# Patient Record
Sex: Male | Born: 1950 | Race: White | Hispanic: No | Smoking: Former smoker
Health system: Southern US, Community
[De-identification: ages and names within clinical notes are randomized; demographics above are authoritative.]

## PROBLEM LIST (undated history)

## (undated) DIAGNOSIS — I10 Essential (primary) hypertension: Secondary | ICD-10-CM

## (undated) DIAGNOSIS — Z951 Presence of aortocoronary bypass graft: Secondary | ICD-10-CM

## (undated) DIAGNOSIS — I639 Cerebral infarction, unspecified: Secondary | ICD-10-CM

## (undated) DIAGNOSIS — Z87891 Personal history of nicotine dependence: Secondary | ICD-10-CM

## (undated) DIAGNOSIS — IMO0002 Reserved for concepts with insufficient information to code with codable children: Secondary | ICD-10-CM

## (undated) DIAGNOSIS — E785 Hyperlipidemia, unspecified: Secondary | ICD-10-CM

## (undated) DIAGNOSIS — R55 Syncope and collapse: Secondary | ICD-10-CM

## (undated) DIAGNOSIS — N200 Calculus of kidney: Secondary | ICD-10-CM

## (undated) DIAGNOSIS — I2510 Atherosclerotic heart disease of native coronary artery without angina pectoris: Secondary | ICD-10-CM

## (undated) DIAGNOSIS — R9430 Abnormal result of cardiovascular function study, unspecified: Secondary | ICD-10-CM

## (undated) HISTORY — DX: Cerebral infarction, unspecified: I63.9

## (undated) HISTORY — DX: Presence of aortocoronary bypass graft: Z95.1

## (undated) HISTORY — DX: Abnormal result of cardiovascular function study, unspecified: R94.30

## (undated) HISTORY — DX: Personal history of nicotine dependence: Z87.891

## (undated) HISTORY — DX: Syncope and collapse: R55

## (undated) HISTORY — DX: Hyperlipidemia, unspecified: E78.5

## (undated) HISTORY — DX: Essential (primary) hypertension: I10

## (undated) HISTORY — DX: Reserved for concepts with insufficient information to code with codable children: IMO0002

## (undated) HISTORY — DX: Atherosclerotic heart disease of native coronary artery without angina pectoris: I25.10

## (undated) HISTORY — DX: Calculus of kidney: N20.0

---

## 2010-06-19 ENCOUNTER — Encounter: Payer: Self-pay | Admitting: Cardiology

## 2010-07-05 DIAGNOSIS — E669 Obesity, unspecified: Secondary | ICD-10-CM

## 2010-07-05 DIAGNOSIS — R3129 Other microscopic hematuria: Secondary | ICD-10-CM

## 2010-07-08 ENCOUNTER — Encounter (INDEPENDENT_AMBULATORY_CARE_PROVIDER_SITE_OTHER): Payer: Self-pay | Admitting: *Deleted

## 2010-07-08 ENCOUNTER — Ambulatory Visit: Payer: Self-pay | Admitting: Cardiology

## 2010-07-11 ENCOUNTER — Ambulatory Visit: Payer: Self-pay | Admitting: Cardiology

## 2010-07-11 ENCOUNTER — Encounter: Payer: Self-pay | Admitting: Cardiology

## 2010-07-15 ENCOUNTER — Encounter (INDEPENDENT_AMBULATORY_CARE_PROVIDER_SITE_OTHER): Payer: Self-pay | Admitting: *Deleted

## 2010-07-17 ENCOUNTER — Encounter: Payer: Self-pay | Admitting: Internal Medicine

## 2010-07-18 ENCOUNTER — Ambulatory Visit: Payer: Self-pay | Admitting: Cardiology

## 2010-07-18 ENCOUNTER — Encounter: Payer: Self-pay | Admitting: Cardiology

## 2010-07-19 ENCOUNTER — Encounter: Payer: Self-pay | Admitting: Physician Assistant

## 2010-07-19 ENCOUNTER — Encounter (INDEPENDENT_AMBULATORY_CARE_PROVIDER_SITE_OTHER): Payer: Self-pay | Admitting: *Deleted

## 2010-07-22 ENCOUNTER — Inpatient Hospital Stay (HOSPITAL_COMMUNITY): Admission: RE | Admit: 2010-07-22 | Discharge: 2010-07-23 | Payer: Self-pay | Admitting: Internal Medicine

## 2010-07-22 ENCOUNTER — Ambulatory Visit: Payer: Self-pay | Admitting: Internal Medicine

## 2010-07-22 ENCOUNTER — Encounter: Payer: Self-pay | Admitting: Cardiology

## 2010-07-22 ENCOUNTER — Ambulatory Visit: Payer: Self-pay | Admitting: Vascular Surgery

## 2010-07-22 ENCOUNTER — Encounter: Payer: Self-pay | Admitting: Thoracic Surgery (Cardiothoracic Vascular Surgery)

## 2010-07-22 ENCOUNTER — Ambulatory Visit: Payer: Self-pay | Admitting: Thoracic Surgery (Cardiothoracic Vascular Surgery)

## 2010-07-23 ENCOUNTER — Encounter: Payer: Self-pay | Admitting: Internal Medicine

## 2010-07-23 ENCOUNTER — Telehealth (INDEPENDENT_AMBULATORY_CARE_PROVIDER_SITE_OTHER): Payer: Self-pay | Admitting: *Deleted

## 2010-07-23 ENCOUNTER — Encounter: Payer: Self-pay | Admitting: Cardiology

## 2010-07-24 ENCOUNTER — Ambulatory Visit: Payer: Self-pay | Admitting: Cardiology

## 2010-07-27 HISTORY — PX: CORONARY ARTERY BYPASS GRAFT: SHX141

## 2010-07-29 ENCOUNTER — Telehealth: Payer: Self-pay | Admitting: Internal Medicine

## 2010-07-30 ENCOUNTER — Inpatient Hospital Stay (HOSPITAL_COMMUNITY)
Admission: RE | Admit: 2010-07-30 | Discharge: 2010-08-03 | Payer: Self-pay | Admitting: Thoracic Surgery (Cardiothoracic Vascular Surgery)

## 2010-08-02 ENCOUNTER — Encounter: Payer: Self-pay | Admitting: Cardiology

## 2010-08-26 ENCOUNTER — Ambulatory Visit: Payer: Self-pay | Admitting: Thoracic Surgery (Cardiothoracic Vascular Surgery)

## 2010-08-26 ENCOUNTER — Encounter: Payer: Self-pay | Admitting: Cardiology

## 2010-08-26 ENCOUNTER — Encounter
Admission: RE | Admit: 2010-08-26 | Discharge: 2010-08-26 | Payer: Self-pay | Admitting: Thoracic Surgery (Cardiothoracic Vascular Surgery)

## 2010-08-29 ENCOUNTER — Encounter: Payer: Self-pay | Admitting: Cardiology

## 2010-09-09 ENCOUNTER — Ambulatory Visit: Payer: Self-pay | Admitting: Physician Assistant

## 2010-09-26 ENCOUNTER — Encounter: Payer: Self-pay | Admitting: Cardiology

## 2010-10-03 ENCOUNTER — Encounter: Payer: Self-pay | Admitting: Cardiology

## 2010-10-03 ENCOUNTER — Encounter: Payer: Self-pay | Admitting: Physician Assistant

## 2010-10-04 ENCOUNTER — Encounter: Payer: Self-pay | Admitting: Cardiology

## 2010-10-07 ENCOUNTER — Encounter (INDEPENDENT_AMBULATORY_CARE_PROVIDER_SITE_OTHER): Payer: Self-pay | Admitting: *Deleted

## 2010-10-16 ENCOUNTER — Encounter: Payer: Self-pay | Admitting: Cardiology

## 2010-10-30 ENCOUNTER — Encounter (INDEPENDENT_AMBULATORY_CARE_PROVIDER_SITE_OTHER): Payer: Self-pay | Admitting: *Deleted

## 2010-10-31 ENCOUNTER — Ambulatory Visit
Admission: RE | Admit: 2010-10-31 | Discharge: 2010-10-31 | Payer: Self-pay | Source: Home / Self Care | Attending: Cardiology | Admitting: Cardiology

## 2010-11-11 ENCOUNTER — Encounter: Payer: Self-pay | Admitting: Physician Assistant

## 2010-11-21 ENCOUNTER — Ambulatory Visit
Admission: RE | Admit: 2010-11-21 | Discharge: 2010-11-21 | Payer: Self-pay | Source: Home / Self Care | Attending: Physician Assistant | Admitting: Physician Assistant

## 2010-11-26 NOTE — Letter (Signed)
Summary: Internal Correspondence/ FAXED PRE-CATH ORDER  Internal Correspondence/ FAXED PRE-CATH ORDER   Imported By: Dorise Hiss 07/22/2010 16:59:13  _____________________________________________________________________  External Attachment:    Type:   Image     Comment:   External Document

## 2010-11-26 NOTE — Letter (Signed)
Summary: Graded Exercise Tolerance Test  [facility] at [facility] [address]. [address]   [place], [place] [address]   Phone: [phone]  Fax: [phone]      [facility] Cardiovascular Services  Graded Exercise Tolerance Test    Roy White  Appointment Date:_  Appointment Time:_   Your doctor has ordered a stress test to help determine the condition of your  heart during exercise. If you take blood pressure medicine , ask your doctor if you should take it the day of your test. You may eat a light meal before your test.  Please be sure to bring the copy of your order with you.   You should dress comfortably, for example: Sweat pants, shorts, or skirt, Loose                                                                                                       short sleeved T-shirt, Rubber soled lace-up shoes (tennis shoes)  You will need to arrive 15 minutes before your appointment time. You will also need to enter at the Main Entrance of the hospital and go to the registration desk. They will direct you to the Cardiovascular Department on the third floor.  You will need to plan on being at the hospital for one hour from registration for this appointment.

## 2010-11-26 NOTE — Miscellaneous (Signed)
Summary: Nuclear  Clinical Lists Changes  Observations: Added new observation of EKG INTERP: Nuclear Cardiology Conclusion : Abnormal LV perfusion.           Abnormal exercise myocardial perfusion with Tc-24m sestamibi imaging. Stress testing induced no chest pain symptoms and ECG changes consistent with ischemia. Horizontal ST depression in lateral leads was diagnostic of ischemia. Global left ventricular systolic function was normal, with an EF of 54%. In addition, there was normal wall motion.  There was a large, completely reversible, apical to basal- inferior/inferolateral defect associated with normal wall motion. This defect was consistent with ischemia. High risk study with large ischemia burden and marked ECG changes persisting several minutes in recovery consistent with ischemia.        (07/18/2010 12:40)      EKG  Procedure date:  07/18/2010  Findings:      Nuclear Cardiology Conclusion : Abnormal LV perfusion.           Abnormal exercise myocardial perfusion with Tc-39m sestamibi imaging. Stress testing induced no chest pain symptoms and ECG changes consistent with ischemia. Horizontal ST depression in lateral leads was diagnostic of ischemia. Global left ventricular systolic function was normal, with an EF of 54%. In addition, there was normal wall motion.  There was a large, completely reversible, apical to basal- inferior/inferolateral defect associated with normal wall motion. This defect was consistent with ischemia. High risk study with large ischemia burden and marked ECG changes persisting several minutes in recovery consistent with ischemia.

## 2010-11-26 NOTE — Miscellaneous (Signed)
Summary: Rehab Report/ CARDIAC REHAB PROGRESS REPORT  Rehab Report/ CARDIAC REHAB PROGRESS REPORT   Imported By: Dorise Hiss 09/27/2010 11:45:32  _____________________________________________________________________  External Attachment:    Type:   Image     Comment:   External Document

## 2010-11-26 NOTE — Letter (Signed)
Summary: REFERRAL FROM [facility] MEDICINE  REFERRAL FROM [facility] MEDICINE   Imported By: Rexene Alberts 07/17/2010 16:35:29  _____________________________________________________________________  External Attachment:    Type:   Image     Comment:   External Document

## 2010-11-26 NOTE — Miscellaneous (Signed)
Summary: NUCLEAR STRESS TEST ORDER  Clinical Lists Changes  Problems: Added new problem of NONSPECIFIC ABNORMAL UNSPEC CV FUNCTION STUDY (ICD-794.30) Orders: Added new Referral order of Nuclear Med (Nuc Med) - Signed

## 2010-11-26 NOTE — Progress Notes (Signed)
Summary: re disability forms   Phone Note Call from Patient   Caller: Patient [phone] Reason for Call: Talk to Nurse Summary of Call: disabilty papers need to be faxed to [phone] attn teresa nance Initial call taken by: Glynda Jaeger,  July 29, 2010 3:23 PM  Follow-up for Phone Call        spoke w/pts mother, pt is sch for CABG tom. am, she gave me pts cell # [phone], called and spoke w/pt he wants [place] to fax his forms to the person and # listed above when they're done, advised forms are complete just waiting on Dr Lindaann Slough signature and we can fax Meredith Staggers, RN  July 29, 2010 6:16 PM      Appended Document: re disability forms  waiting to be signed paperwork signed and sent to medical records to be scanned into chart.

## 2010-11-26 NOTE — Progress Notes (Signed)
  Pt Dropped off papers from [facility], He also completed Packet for [employer] all sent to [employer]  July 23, 2010 4:04 PM

## 2010-11-26 NOTE — Miscellaneous (Signed)
Summary: Rehab Report/ CARDIAC REHAB PROGRESS REPORT  Rehab Report/ CARDIAC REHAB PROGRESS REPORT   Imported By: Dorise Hiss 08/30/2010 10:02:47  _____________________________________________________________________  External Attachment:    Type:   Image     Comment:   External Document

## 2010-11-26 NOTE — Progress Notes (Signed)
Summary: Office Visit/ TCT OFFICE VISIT  Office Visit/ TCT OFFICE VISIT   Imported By: Dorise Hiss 09/09/2010 09:56:46  _____________________________________________________________________  External Attachment:    Type:   Image     Comment:   External Document

## 2010-11-26 NOTE — Letter (Signed)
Summary: Pharmacist, community at [facility]. [address]   [place], [place] [address]   Phone: [phone]  Fax: [phone]      [facility] Cardiovascular Services  Cardiolite Stress Test     Roy White  Your doctor has ordered a Cardiolite Stress Test to help determine the condition of your heart during stress. If you take blood pressure medicine, ask your doctor if you should take it the day of your test. You should not have anything to eat or drink at least 4 hours before your test is scheduled, and no caffeine (coffee, tea, decaf. or chocolate) for 24 hours before your test.   You will need to register at the Outpatient/Main Entrance at the hospital 30 minutes before your appointment time. It is a good idea to bring a copy of your order with you. They will direct you to the Diagnostic Imaging (Radiology) Department.  You will be asked to undress from the waist up and be given a hospital gown to wear, so dress comfortably from the waist down, for example:    Sweat pants, shorts or skirt   Rubber-soled lace up shoes (i.e. tennis shoes)  Plan on about three hours from registration to release from the hospital.    Date of Test:              Time of Test

## 2010-11-26 NOTE — Assessment & Plan Note (Signed)
Summary: NP-CHEST PAIN  HYPERTENSION   Visit Type:  Initial Consult Primary Provider:  Tammy Sours Napier,MD  CC:  SOB and Abnormal EKG.  History of Present Illness: The patient has no prior cardiac history. Over the summer while mowing the lawn he noticed episodes of shortness of breath with some mild chest tightness. This only persisted during this activity. It would go away when he rested. He did not describe neck or arm discomfort. He was not describing resting symptom and had no PND or orthopnea. He was not describing any palpitations, presyncope or syncope. He was noted to be hypertensive and has since been treated. He says his blood pressures have been much better and he is no longer getting these symptoms. Of note he did have an EKG demonstrating T wave inversions in the anterolateral leads with LVH voltage. He's had no prior cardiac workup. 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  Preventive Screening-Counseling & Management  Alcohol-Tobacco     Smoking Status: quit     Year Quit: 2005  Current Medications (verified): 1)  Lisinopril-Hydrochlorothiazide 20-12.5 Mg Tabs (Lisinopril-Hydrochlorothiazide) .... Take 1 Tablet By Mouth Once A Day  Allergies (verified): No Known Drug Allergies  Comments:  Nurse/Medical Assistant: The patient's medication list and allergies were reviewed with the patient and were updated in the Medication and Allergy Lists.  Past History:  Past Medical History: Hypertension (off and on x years) Nephrolithiasis  Past Surgical History: None  Family History: Mother alive CVAs Father alive no CAD No early heart disease  Social History: Full Time-works at [employer]  Tobacco off and on x 10 years.  Quit 5 years ago.Smoking Status:  quit  Review of Systems       Positive for heartburn. Otherwise as stated in the history of  present illness negative for all other systems.  Vital Signs:  Patient profile:   60 year old male Height:      64 inches Weight:      176 pounds BMI:     30.32 O2 Sat:      97 % on Room air Pulse rate:   66 / minute BP sitting:   123 / 84  (left arm) Cuff size:   regular  Vitals Entered By: Carlye Grippe (July 08, 2010 1:03 PM)  Nutrition Counseling: Patient's BMI is greater than 25 and therefore counseled on weight management options.  O2 Flow:  Room air CC: SOB, Abnormal EKG   Physical Exam  General:  Well developed, well nourished, in no acute distress. Head:  normocephalic and atraumatic Eyes:  PERRLA/EOM intact; conjunctiva and lids normal. Neck:  Neck supple, no JVD. No masses, thyromegaly or abnormal cervical nodes. Chest Wall:  no deformities or breast masses noted Lungs:  Clear bilaterally to auscultation and percussion. Abdomen:  Bowel sounds positive; abdomen soft and non-tender without masses, organomegaly, or hernias noted. No hepatosplenomegaly. Msk:  Back normal, normal gait. Muscle strength and tone normal. Extremities:  No clubbing or cyanosis. Neurologic:  Alert and oriented x 3. Skin:  Intact without lesions or rashes. Cervical Nodes:  no significant adenopathy Axillary Nodes:  no significant adenopathy Inguinal Nodes:  no significant adenopathy Psych:  Normal affect.   Detailed Cardiovascular Exam  Neck    Carotids: Carotids full and equal bilaterally without bruits.      Neck Veins: Normal, no JVD.  Heart    Inspection: no deformities or lifts noted.      Palpation: normal PMI with no thrills palpable.      Auscultation: regular rate and rhythm, S1, S2 without murmurs, rubs, gallops, or clicks.    Vascular    Abdominal Aorta: no palpable masses, pulsations, or audible bruits.      Femoral Pulses: normal femoral pulses bilaterally.      Pedal Pulses: normal pedal pulses bilaterally.      Radial Pulses: normal radial pulses  bilaterally.      Peripheral Circulation: no clubbing, cyanosis, or edema noted with normal capillary refill.     EKG  Procedure date:  06/19/2010  Findings:      Sinus rhythm, rate 61, left ventricle hypertrophy by voltage criteria, anterolateral T wave inversions, no old EKGs for comparison  Impression & Recommendations:  Problem # 1:  OTHER CHEST PAIN (ICD-786.59)  The patient described chest tightness and dyspnea. This has since resolved. He does have an abnormal EKG which I think is LVH with rate pole. However, given the combination I think screening with a treadmill test is indicated. Further evaluation will be based on these results. Regardless he needs primary risk reduction.  Orders: GXT (GXT)  Problem # 2:  HYPERTENSION (ICD-401.9) His blood pressure is now well controlled and he will continue the meds as listed.  Problem # 3:  DYSLIPIDEMIA (ICD-272.4) I did review lipids from August with an LDL of 126, HDL 35 and triglycerides of 208. We discussed diet and in particular exercise. He's been more compliant with both and I suspect he'll have improvement. I think optimal for this patient would be an LDL less than 100 and HDL greater and 40. At this point pharmacologic therapy is not indicated.  Patient Instructions: 1)  Exercise stress test 2)  Follow up as needed

## 2010-11-26 NOTE — Letter (Signed)
Summary: External Correspondence/ PROGRESS NOTE W LABS DR. NAPIER  External Correspondence/ PROGRESS NOTE W LABS DR. NAPIER   Imported By: Dorise Hiss 06/26/2010 14:42:19  _____________________________________________________________________  External Attachment:    Type:   Image     Comment:   External Document

## 2010-11-26 NOTE — Letter (Signed)
Summary: Cardiac Cath Instructions - Main Lab  [facility] at [facility]. [address]   [place], [place] [address]   Phone: [phone]  Fax: [phone]     07/19/2010 MRN: [id number]  Roy White [address] [place], [place]  [address]  Dear Roy White,   You are scheduled for Cardiac Catheterization on Monday, July 22, 2010 with Dr. Gala Romney.  Please arrive at the [facility] at       0630 am on the day of your procedure.  1. DIET     __X__ Nothing to eat or drink after midnight except your medications with a sip of water.  2. Come to the [place] office on             for lab work.  The lab at [facility] is open from 8:30 a.m. to 1:30 p.m. and 2:30 p.m. to 5:00 p.m.  The lab at [facility] is open from 7:30 a.m. to 5:30 p.m.  You do not have to be fasting.  3. MAKE SURE YOU TAKE YOUR ASPIRIN and Plavix.  4. __X___ DO NOT TAKE these medications the morning of your procedure: Lisinopril/HCTZ     5. __X___ YOU MAY TAKE all of your remaining medications with a small amount of water.  6.__X___ START NEW medications: ASA 325MG  by mouth once daily, PLAVIX 75MG  TAKE 2 TABLETS ON FRI, SEPT 23RD AND THEN 1 TABLET DAILY, TOPROL XL (METOPROLOL) 25MG  by mouth once daily.  7._____ Pre-med instructions:     _______________________________________________________________________  8. Plan for one night stay - bring personal belongings (i.e. toothpaste, toothbrush, etc.)  9. Bring a current list of your medications and current insurance cards.  10.Must have a responsible person to drive you home.   11.Someone must be with you for the first 24 hours after you arrive home.  9. Please wear clothes that are easy to get on and off and wear slip-on shoes.  *Special note: Every effort is made to have your procedure done on time.  Occasionally there are emergencies that present themselves at the hospital that may  cause delays.  Please be patient if a delay does occur.  If you have any questions after you get home, please call the office at the number listed above.  Cyril Loosen, RN, BSN

## 2010-11-26 NOTE — Assessment & Plan Note (Signed)
Summary: EPH-POST HEART SURGERY AT [facility] 10/3   Visit Type:  Follow-up Primary Provider:  Cordie Grice   History of Present Illness: patient presents for post hospital followup, after undergoing three-vessel CABG, by Dr. Cornelius Moras, at [facility], on October 4. He has been cleared by them, and is enrolled in cardiac rehabilitation.  Clinically, he feels "much better", and denies any exertional chest discomfort. He does have some mild soreness. He denies any tach palpitations.  Patient reports an episode yesterday where he experienced transient hypotension (systolic 90s), after having walked 3 miles. He did take his low dose Toprol in the morning. He typically has blood pressure readings around 130s systolic, and was on an ACE inhibitor/HCTZ, prior to his cardiac workup. He is no longer on this medication.  Postop course was benign, with no documented dysrhythmias.  Preventive Screening-Counseling & Management  Alcohol-Tobacco     Smoking Status: quit     Year Quit: 2005  Current Medications (verified): 1)  Aspirin Ec 325 Mg Tbec (Aspirin) .... Take One Tablet By Mouth Daily 2)  Toprol Xl 25 Mg Xr24h-Tab (Metoprolol Succinate) .... Take 1 Tablet By Mouth Once A Day 3)  Crestor 20 Mg Tabs (Rosuvastatin Calcium) .... Take 1 Tablet By Mouth Once A Day 4)  Indomethacin 25 Mg Caps (Indomethacin) .... Take 1 Tablet By Mouth Once A Day 5)  Robitussin Cough/chest Dm Max 10-200 Mg/8ml Liqd (Dextromethorphan-Guaifenesin) .... As Needed  Allergies (verified): No Known Drug Allergies  Comments:  Nurse/Medical Assistant: The patient's medication bottles and allergies were reviewed with the patient and were updated in the Medication and Allergy Lists.  Past History:  Past Medical History: Hypertension (off and on x years) Nephrolithiasis CAD... 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 All other systems negative.   Vital Signs:  Patient profile:   60 year old male Height:      64 inches Weight:      162 pounds Pulse rate:   77 / minute BP sitting:   138 / 90  (left arm) Cuff size:   regular  Vitals Entered By: Carlye Grippe (September 09, 2010 10:10 AM)  Physical Exam  Additional Exam:  GEN: 60 year old male, sitting upright, no distress HEENT: NCAT,PERRLA,EOMI NECK: palpable pulses, no bruits; no JVD; no TM LUNGS: CTA bilaterally HEART: RRR (S1S2); no significant murmurs; no rubs; no gallops ABD: soft, NT; intact BS EXT: intact distal pulses; no edema SKIN: warm, dry MUSC: no obvious deformity NEURO: A/O (x3)     EKG  Procedure date:  09/09/2010  Findings:      normal sinus rhythm at 74 bpm; normal axis; T wave inversion in inferolateral leads  Impression & Recommendations:  Problem # 1:  CAD (ICD-414.00)  clinically stable, following recent CABG, 10/11. Patient has normal LVEF. On minimal medications, including low-dose beta blocker.  Problem # 2:  DYSLIPIDEMIA (ICD-272.4)  recently started on Crestor. Reassess lipid status with FLP in 2 months. Aggressive management recommended with target LDL 70 or less, if feasible.  Problem # 3:  HYPERTENSION (ICD-401.9)  continue monitoring closely. Currently only on low-dose beta blocker, but had been on ACE/HCTZ prior to recent cardiac workup.  Other Orders: EKG w/ Interpretation (93000) Future Orders: T-Lipid Profile (84132-44010) ... 11/04/2010  Patient Instructions: 1)  Your physician wants you to follow-up  in: 3 months. You will receive a reminder letter in the mail one-two months in advance. If you don't receive a letter, please call our office to schedule the follow-up appointment. 2)  Your physician recommends that you go to the [facility] for a FASTING lipid profile and liver  function labs:  DO AROUND November 11, 2010. TAKE ORDER WITH YOU TO THE [facility]. Do not eat or drink after midnight.  3)  Your physician recommends that you continue on your current medications as directed. Please refer to the Current Medication list given to you today.

## 2010-11-28 ENCOUNTER — Encounter: Payer: Self-pay | Admitting: Physician Assistant

## 2010-11-28 NOTE — Miscellaneous (Signed)
Summary: Rehab Report/ CARDIAC REHAB DISCHARGE SUMMARY  Rehab Report/ CARDIAC REHAB DISCHARGE SUMMARY   Imported By: Dorise Hiss 10/24/2010 16:39:24  _____________________________________________________________________  External Attachment:    Type:   Image     Comment:   External Document

## 2010-11-28 NOTE — Consult Note (Signed)
Summary: CARDIOLOGY CONSULT /[facility]  CARDIOLOGY CONSULT /[facility]   Imported By: Zachary George 10/30/2010 10:43:52  _____________________________________________________________________  External Attachment:    Type:   Image     Comment:   External Document

## 2010-11-28 NOTE — Letter (Signed)
Summary: TC & TS  TC & TS   Imported By: Marylou Mccoy 10/31/2010 16:28:38  _____________________________________________________________________  External Attachment:    Type:   Image     Comment:   External Document

## 2010-11-28 NOTE — Assessment & Plan Note (Signed)
Summary: f2wks  --agh   Visit Type:  Follow-up Primary Provider:  Tammy Sours Napier,MD   History of Present Illness: Roy White returns for early scheduled follow up with me.  When last seen, I tapered him off Toprol, and decreased aspirin. I concluded that his recent syncopal episode was most likely secondary to dehydration. He had already begun to feel better off ACE inhibitor. He tells me today that he continues to feel better, having now been taken off beta blocker completely.  He denies any falls or postural dizziness. He continues to report no chest pain.  Recent labs notable for LDL 120 on current dose of Crestor, which was started following CABG.  Preventive Screening-Counseling & Management  Alcohol-Tobacco     Smoking Status: quit     Year Quit: 2003  Current Medications (verified): 1)  Aspirin 81 Mg Tbec (Aspirin) .... Take 1 Tablet By Mouth Once A Day 2)  Crestor 20 Mg Tabs (Rosuvastatin Calcium) .... Take 1 Tablet By Mouth Once A Day 3)  Indomethacin 25 Mg Caps (Indomethacin) .... Take 1 Tablet By Mouth Once A Day 4)  Robitussin Cough/chest Dm Max 10-200 Mg/76ml Liqd (Dextromethorphan-Guaifenesin) .... As Needed  Allergies (verified): No Known Drug Allergies  Comments:  Nurse/Medical Assistant: The Roy White's medications and allergies were verbally reviewed with the Roy White and were updated in the Medication and Allergy Lists.  Past History:  Past Medical History: Last updated: 09/09/2010 Hypertension (off and on x years) Nephrolithiasis CAD... 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 All other systems negative.   Vital Signs:  Roy White profile:   60 year old male Height:      64 inches Weight:      160 pounds Pulse rate:   57 / minute BP sitting:    132 / 82  (left arm) Cuff size:   regular  Vitals Entered By: Carlye Grippe (November 21, 2010 9:46 AM)  Physical Exam  Additional Exam:  GEN: 60 year old male, sitting upright, no distress HEENT: NCAT,PERRLA,EOMI NECK: palpable pulses, no bruits; no JVD; no TM LUNGS: CTA bilaterally HEART: RRR (S1S2); no significant murmurs; no rubs; no gallops ABD: soft, NT; intact BS EXT: intact distal pulses; no edema SKIN: warm, dry MUSC: no obvious deformity NEURO: A/O (x3)     Impression & Recommendations:  Problem # 1:  SYNCOPE (ICD-780.2)  Resolved, following recent adjustment of his medications. He is now off all antihypertensives. I suspect that his recent syncopal episode was do to a combination of volume depletion and effect of beta blocker. Of note, he was not on any anti-HTN, prior to CABG last October. Would not resume anti-HTN therapy, unless he demonstrates persistently elevated BPs. He states that these are in the 120/80 range at home.  Problem # 2:  DYSLIPIDEMIA (ICD-272.4)  recent LDL 120. Will increase Crestor to 40 mg daily, and repeat FLP/LFT profile in 12 weeks.  His updated medication list for this problem includes:    Crestor 40 Mg Tabs (Rosuvastatin calcium) .Marland Kitchen... Take one tablet by mouth at bedtime.  Roy White Instructions: 1)  Your physician wants you to follow-up in: 6 months. You will receive a reminder letter in the mail one-two months in advance. If you don't receive a letter, please call our office to schedule the follow-up appointment. (Dr. Myrtis Ser) 2)  Your physician recommends that  you go to the [facility] for a FASTING lipid profile and liver function labs:  DO IN 3 MONTHS. CALL THE OFFICE TO SEND ORDER TO THE [facility]. 3)  Increase Crestor to 40mg  by mouth once at bedtime. You may take 2 of your 20mg  tablets until gone and then get new prescription filled for 40mg  tablets. Prescriptions: CRESTOR 40 MG TABS (ROSUVASTATIN CALCIUM) Take one tablet by  mouth at bedtime.  #30 x 6   Entered by:   Cyril Loosen, RN, BSN   Authorized by:   Nelida Meuse, PA-C   Signed by:   Cyril Loosen, RN, BSN on 11/21/2010   Method used:   Electronically to        [facility] (retail)       [address]       [place], [place]  [address]       Ph: [phone]       Fax: [phone]   RxID:   [id number]  I have reviewed and approved all prescriptions at the time of the office visit. Nelida Meuse, PA-C  November 21, 2010 10:16 AM

## 2010-11-28 NOTE — Letter (Signed)
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   [place], [place] [address]   Phone: [phone]  Fax: [phone]        October 07, 2010 MRN: [id number]    Shaheed Mallek [address] [place], [place]  [address]    Dear Mr. NOURI,  Your test ordered by Selena Batten has been reviewed by your physician (or physician assistant) and was found to be normal or stable. Your physician (or physician assistant) felt no changes were needed at this time.  ____ Echocardiogram  ____ Cardiac Stress Test  __X__ Lab Work  ____ Peripheral vascular study of arms, legs or neck  ____ CT scan or X-ray  ____ Lung or Breathing test  ____ Other:   Thank you.   Cyril Loosen, RN, BSN    Duane Boston, M.D., F.A.C.C. Thressa Sheller, M.D., F.A.C.C. Oneal Grout, M.D., F.A.C.C. Cheree Ditto, M.D., F.A.C.C. Daiva Nakayama, M.D., F.A.C.C. Kenney Houseman, M.D., F.A.C.C. Jeanne Ivan, PA-C

## 2010-11-28 NOTE — Assessment & Plan Note (Signed)
Summary: [facility] D/C [facility] 12-9 PER DR. PARSONS   Visit Type:  Follow-up Primary Provider:  Cordie Grice   History of Present Illness: pt presents for post [facility] f/u, after recent referral to Dr Myrtis Ser for evaluation of syncope.  pt ruled out for MI, and was treated for hypotension with D/C of Lisinopril. Toprol was continued. Orthostatics were done, notable for no significant drop in SBP, but with increase in HR of 30 bpm.  since his release, he has not had recurrent LOC. his initial episode occurred during Cardiac Rehab, after having completed exercising, and while seated. he states he slumped to the floor, and had a recorded SBP of approx. 60.  the episode was apparently brief in duration.   pt denies any tachypalpitations. he had some dizziness this past Sat, while washing his car: no CP, no frank LOC. symptoms resolved with rest, with reported SBP approx. 80.  Preventive Screening-Counseling & Management  Alcohol-Tobacco     Smoking Status: quit     Year Quit: 2003  Problems Prior to Update: 1)  Dyslipidemia  (ICD-272.4) 2)  Cad  (ICD-414.00) 3)  Oth Nonspecific Abnorm Cv System Function Study  (ICD-794.39) 4)  Nonspecific Abnormal Unspec Cv Function Study  (ICD-794.30) 5)  Dyslipidemia  (ICD-272.4) 6)  Microscopic Hematuria  (ICD-599.72) 7)  Obesity, Unspecified  (ICD-278.00) 8)  Other Chest Pain  (ICD-786.59) 9)  Hypertension  (ICD-401.9)  Current Medications (verified): 1)  Aspirin 81 Mg Tbec (Aspirin) .... Take 1 Tablet By Mouth Once A Day 2)  Toprol Xl 25 Mg Xr24h-Tab (Metoprolol Succinate) .... Take 1/2 Tab (12.5mg ) Daily X 7 Days, Then Stop 3)  Crestor 20 Mg Tabs (Rosuvastatin Calcium) .... Take 1 Tablet By Mouth Once A Day 4)  Indomethacin 25 Mg Caps (Indomethacin) .... Take 1 Tablet By Mouth Once A Day 5)  Robitussin Cough/chest Dm Max 10-200 Mg/69ml Liqd (Dextromethorphan-Guaifenesin) .... As Needed  Allergies (verified): No Known Drug  Allergies  Comments:  Nurse/Medical Assistant: The patient's medications and allergies were reviewed with the patient and were updated in the Medication and Allergy Lists. Reviewed list w/ pt. Tammi Romine CMA (October 31, 2010 1:55 PM)  Past History:  Past Medical History: Last updated: 09/09/2010 Hypertension (off and on x years) Nephrolithiasis CAD... 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 All other systems negative.   Vital Signs:  Patient profile:   60 year old male Height:      64 inches Weight:      166 pounds Pulse rate:   65 / minute BP sitting:   118 / 83  (left arm) Cuff size:   regular  Physical Exam  Additional Exam:  GEN: 60 year old male, sitting upright, no distress HEENT: NCAT,PERRLA,EOMI NECK: palpable pulses, no bruits; no JVD; no TM LUNGS: CTA bilaterally HEART: RRR (S1S2); no significant murmurs; no rubs; no gallops ABD: soft, NT; intact BS EXT: intact distal pulses; no edema SKIN: warm, dry MUSC: no obvious deformity NEURO: A/O (x3)     Impression & Recommendations:  Problem # 1:  SYNCOPE (ICD-780.2)  suspect secondary to dehydration, as corroborated by recent orthostatic BPs at [facility] no evidence of ischemia or dysrhythmia. he reports feeling better, since Ace inhibitor D/C'd. will taper and D/C Toprol, as well, in the absence of any h/o of MI, dysrhythmia, and  with NL LV. will scedule early f/u with me to reassess clinical status, as well as HR/BP. pt states that BP generally under 140 systolic, and he was only recently diagnosed with HTN and placed on ACE/HCTZ, per his primary MD.  Problem # 2:  CAD (ICD-414.00)  quiescent, since undergoing recent CABG.  Patient Instructions: 1)  Decrease Toprol to 12.5mg  daily x 7 days,  then stop 2)  Decrease Aspirin to 81mg  daily 3)  Follow up in  2 weeks

## 2010-11-28 NOTE — Letter (Signed)
Summary: Reminder for labs  [employer] at [facility]  [address]   [place], [place] [address]   Phone: [phone]  Fax: [phone]        October 30, 2010 MRN: [id number]    Roy White [address] [place], [place]  [address]    Dear [name],   According to our records, you are due for lab work to check your cholesterol and liver function.  Please, take the enclosed order to the [facility]. Do not eat or drink after midnight.     Sincerely,  Cyril Loosen, RN, BSN  This letter has been electronically signed by your physician.

## 2010-11-28 NOTE — Medication Information (Signed)
Summary: [facility] D/C MEDICATION SHEET  [facility] D/C MEDICATION SHEET   Imported By: Zachary George 10/30/2010 10:54:48  _____________________________________________________________________  External Attachment:    Type:   Image     Comment:   External Document

## 2010-11-29 ENCOUNTER — Encounter (INDEPENDENT_AMBULATORY_CARE_PROVIDER_SITE_OTHER): Payer: Self-pay | Admitting: *Deleted

## 2010-12-04 NOTE — Letter (Signed)
Summary: Engineer, materials at [facility]  [address]   [place], [place] [address]   Phone: [phone]  Fax: [phone]        November 29, 2010 MRN: [id number]    Roy White [address] [place], [place]  [address]    Dear [name],  Your test ordered by Selena Batten has been reviewed by your physician (or physician assistant) and was found to be normal or stable. Your physician (or physician assistant) felt no changes were needed at this time.  ____ Echocardiogram  ____ Cardiac Stress Test  __X__ Lab Work  ____ Peripheral vascular study of arms, legs or neck  ____ CT scan or X-ray  ____ Lung or Breathing test  ____ Other:   Thank you.   Cyril Loosen, RN, BSN    Duane Boston, M.D., F.A.C.C. Thressa Sheller, M.D., F.A.C.C. Oneal Grout, M.D., F.A.C.C. Cheree Ditto, M.D., F.A.C.C. Daiva Nakayama, M.D., F.A.C.C. Kenney Houseman, M.D., F.A.C.C. Jeanne Ivan, PA-C

## 2011-01-09 LAB — POCT I-STAT 3, ART BLOOD GAS (G3+)
Bicarbonate: 23.5 mEq/L (ref 20.0–24.0)
Bicarbonate: 25 mEq/L — ABNORMAL HIGH (ref 20.0–24.0)
O2 Saturation: 100 %
O2 Saturation: 99 %
TCO2: 25 mmol/L (ref 0–100)
TCO2: 26 mmol/L (ref 0–100)
pCO2 arterial: 39 mmHg (ref 35.0–45.0)
pCO2 arterial: 42 mmHg (ref 35.0–45.0)
pCO2 arterial: 45.9 mmHg — ABNORMAL HIGH (ref 35.0–45.0)
pH, Arterial: 7.318 — ABNORMAL LOW (ref 7.350–7.450)
pH, Arterial: 7.349 — ABNORMAL LOW (ref 7.350–7.450)
pO2, Arterial: 158 mmHg — ABNORMAL HIGH (ref 80.0–100.0)
pO2, Arterial: 164 mmHg — ABNORMAL HIGH (ref 80.0–100.0)
pO2, Arterial: 224 mmHg — ABNORMAL HIGH (ref 80.0–100.0)

## 2011-01-09 LAB — URINALYSIS, ROUTINE W REFLEX MICROSCOPIC
Bilirubin Urine: NEGATIVE
Hgb urine dipstick: NEGATIVE
Specific Gravity, Urine: 1.03 (ref 1.005–1.030)
Urobilinogen, UA: 1 mg/dL (ref 0.0–1.0)

## 2011-01-09 LAB — CBC
HCT: 27.5 % — ABNORMAL LOW (ref 39.0–52.0)
HCT: 28.6 % — ABNORMAL LOW (ref 39.0–52.0)
HCT: 34.4 % — ABNORMAL LOW (ref 39.0–52.0)
HCT: 35.4 % — ABNORMAL LOW (ref 39.0–52.0)
HCT: 44.2 % (ref 39.0–52.0)
Hemoglobin: 12.1 g/dL — ABNORMAL LOW (ref 13.0–17.0)
Hemoglobin: 9.1 g/dL — ABNORMAL LOW (ref 13.0–17.0)
Hemoglobin: 9.2 g/dL — ABNORMAL LOW (ref 13.0–17.0)
Hemoglobin: 9.6 g/dL — ABNORMAL LOW (ref 13.0–17.0)
Hemoglobin: 15.2 g/dL (ref 13.0–17.0)
Hemoglobin: 16.4 g/dL (ref 13.0–17.0)
MCH: 28 pg (ref 26.0–34.0)
MCH: 28 pg (ref 26.0–34.0)
MCH: 28.9 pg (ref 26.0–34.0)
MCH: 29.1 pg (ref 26.0–34.0)
MCHC: 32.2 g/dL (ref 30.0–36.0)
MCHC: 33.1 g/dL (ref 30.0–36.0)
MCHC: 33.2 g/dL (ref 30.0–36.0)
MCHC: 34 g/dL (ref 30.0–36.0)
MCHC: 34.2 g/dL (ref 30.0–36.0)
MCHC: 34.4 g/dL (ref 30.0–36.0)
MCHC: 34.7 g/dL (ref 30.0–36.0)
MCV: 83.9 fL (ref 78.0–100.0)
MCV: 84.3 fL (ref 78.0–100.0)
MCV: 84.9 fL (ref 78.0–100.0)
MCV: 87.2 fL (ref 78.0–100.0)
Platelets: 146 10*3/uL — ABNORMAL LOW (ref 150–400)
Platelets: 171 10*3/uL (ref 150–400)
Platelets: 203 10*3/uL (ref 150–400)
RBC: 3.41 MIL/uL — ABNORMAL LOW (ref 4.22–5.81)
RBC: 4.1 MIL/uL — ABNORMAL LOW (ref 4.22–5.81)
RBC: 5.24 MIL/uL (ref 4.22–5.81)
RDW: 12.9 % (ref 11.5–15.5)
RDW: 13 % (ref 11.5–15.5)
RDW: 13.2 % (ref 11.5–15.5)
WBC: 14.7 10*3/uL — ABNORMAL HIGH (ref 4.0–10.5)
WBC: 20.3 10*3/uL — ABNORMAL HIGH (ref 4.0–10.5)
WBC: 11.2 10*3/uL — ABNORMAL HIGH (ref 4.0–10.5)

## 2011-01-09 LAB — POCT I-STAT, CHEM 8
Calcium, Ion: 1.1 mmol/L — ABNORMAL LOW (ref 1.12–1.32)
Chloride: 105 mEq/L (ref 96–112)
Glucose, Bld: 148 mg/dL — ABNORMAL HIGH (ref 70–99)
HCT: 36 % — ABNORMAL LOW (ref 39.0–52.0)
Hemoglobin: 12.2 g/dL — ABNORMAL LOW (ref 13.0–17.0)

## 2011-01-09 LAB — BASIC METABOLIC PANEL
BUN: 11 mg/dL (ref 6–23)
BUN: 18 mg/dL (ref 6–23)
CO2: 24 mEq/L (ref 19–32)
CO2: 27 mEq/L (ref 19–32)
CO2: 29 mEq/L (ref 19–32)
CO2: 21 mEq/L (ref 19–32)
Calcium: 7.9 mg/dL — ABNORMAL LOW (ref 8.4–10.5)
Calcium: 8 mg/dL — ABNORMAL LOW (ref 8.4–10.5)
Calcium: 9.5 mg/dL (ref 8.4–10.5)
Chloride: 108 mEq/L (ref 96–112)
Creatinine, Ser: 0.99 mg/dL (ref 0.4–1.5)
Creatinine, Ser: 1.1 mg/dL (ref 0.4–1.5)
Glucose, Bld: 107 mg/dL — ABNORMAL HIGH (ref 70–99)
Glucose, Bld: 122 mg/dL — ABNORMAL HIGH (ref 70–99)
Glucose, Bld: 128 mg/dL — ABNORMAL HIGH (ref 70–99)
Glucose, Bld: 97 mg/dL (ref 70–99)
Potassium: 4.4 mEq/L (ref 3.5–5.1)
Sodium: 137 mEq/L (ref 135–145)
Sodium: 137 mEq/L (ref 135–145)

## 2011-01-09 LAB — BLOOD GAS, ARTERIAL
Bicarbonate: 24.2 mEq/L — ABNORMAL HIGH (ref 20.0–24.0)
Patient temperature: 98.6
TCO2: 25.5 mmol/L (ref 0–100)
pH, Arterial: 7.402 (ref 7.350–7.450)
pO2, Arterial: 86.8 mmHg (ref 80.0–100.0)

## 2011-01-09 LAB — CREATININE, SERUM
Creatinine, Ser: 0.98 mg/dL (ref 0.4–1.5)
Creatinine, Ser: 1.1 mg/dL (ref 0.4–1.5)
GFR calc Af Amer: >60 mL/min (ref >60)
GFR calc non Af Amer: >60 mL/min (ref >60)
GFR calc non Af Amer: >60 mL/min (ref >60)

## 2011-01-09 LAB — HEMOGLOBIN A1C: Hgb A1c MFr Bld: 5.5 % (ref <5.7)

## 2011-01-09 LAB — POCT I-STAT 4, (NA,K, GLUC, HGB,HCT)
Glucose, Bld: 101 mg/dL — ABNORMAL HIGH (ref 70–99)
Glucose, Bld: 109 mg/dL — ABNORMAL HIGH (ref 70–99)
Glucose, Bld: 116 mg/dL — ABNORMAL HIGH (ref 70–99)
Glucose, Bld: 95 mg/dL (ref 70–99)
HCT: 26 % — ABNORMAL LOW (ref 39.0–52.0)
HCT: 34 % — ABNORMAL LOW (ref 39.0–52.0)
HCT: 34 % — ABNORMAL LOW (ref 39.0–52.0)
Hemoglobin: 11.6 g/dL — ABNORMAL LOW (ref 13.0–17.0)
Hemoglobin: 8.8 g/dL — ABNORMAL LOW (ref 13.0–17.0)
Potassium: 4.2 mEq/L (ref 3.5–5.1)
Potassium: 4.4 mEq/L (ref 3.5–5.1)
Potassium: 4.6 mEq/L (ref 3.5–5.1)
Potassium: 4.9 mEq/L (ref 3.5–5.1)
Sodium: 131 mEq/L — ABNORMAL LOW (ref 135–145)
Sodium: 137 mEq/L (ref 135–145)
Sodium: 138 mEq/L (ref 135–145)

## 2011-01-09 LAB — APTT
aPTT: 34 seconds (ref 24–37)
aPTT: 29 seconds (ref 24–37)

## 2011-01-09 LAB — GLUCOSE, CAPILLARY
Glucose-Capillary: 101 mg/dL — ABNORMAL HIGH (ref 70–99)
Glucose-Capillary: 130 mg/dL — ABNORMAL HIGH (ref 70–99)
Glucose-Capillary: 131 mg/dL — ABNORMAL HIGH (ref 70–99)
Glucose-Capillary: 146 mg/dL — ABNORMAL HIGH (ref 70–99)
Glucose-Capillary: 167 mg/dL — ABNORMAL HIGH (ref 70–99)

## 2011-01-09 LAB — MAGNESIUM
Magnesium: 2.4 mg/dL (ref 1.5–2.5)
Magnesium: 3.2 mg/dL — ABNORMAL HIGH (ref 1.5–2.5)

## 2011-01-09 LAB — HEMOGLOBIN AND HEMATOCRIT, BLOOD: HCT: 27.5 % — ABNORMAL LOW (ref 39.0–52.0)

## 2011-01-09 LAB — COMPREHENSIVE METABOLIC PANEL
ALT: 22 U/L (ref 0–53)
Alkaline Phosphatase: 56 U/L (ref 39–117)
CO2: 23 mEq/L (ref 19–32)
Chloride: 105 mEq/L (ref 96–112)
GFR calc non Af Amer: >60 mL/min (ref >60)
Glucose, Bld: 86 mg/dL (ref 70–99)
Potassium: 4 mEq/L (ref 3.5–5.1)
Sodium: 137 mEq/L (ref 135–145)
Total Bilirubin: 1.3 mg/dL — ABNORMAL HIGH (ref 0.3–1.2)

## 2011-01-09 LAB — PROTIME-INR
INR: 1.27 (ref 0.00–1.49)
Prothrombin Time: 13.5 seconds (ref 11.6–15.2)
Prothrombin Time: 13.8 seconds (ref 11.6–15.2)

## 2011-01-09 LAB — LIPID PANEL
Total CHOL/HDL Ratio: 7.1 RATIO
VLDL: 76 mg/dL — ABNORMAL HIGH (ref 0–40)

## 2011-01-09 LAB — SURGICAL PCR SCREEN: Staphylococcus aureus: NEGATIVE

## 2011-01-09 LAB — TYPE AND SCREEN: ABO/RH(D): A POS

## 2011-02-19 ENCOUNTER — Encounter (INDEPENDENT_AMBULATORY_CARE_PROVIDER_SITE_OTHER): Payer: Self-pay | Admitting: Internal Medicine

## 2011-02-19 ENCOUNTER — Other Ambulatory Visit: Payer: Self-pay | Admitting: *Deleted

## 2011-02-19 DIAGNOSIS — Z79899 Other long term (current) drug therapy: Secondary | ICD-10-CM

## 2011-02-19 DIAGNOSIS — E782 Mixed hyperlipidemia: Secondary | ICD-10-CM

## 2011-03-11 NOTE — Assessment & Plan Note (Signed)
OFFICE VISIT   Roy White, Roy White  DOB:  06-20-1951                                        August 26, 2010  CHART #:  [id number]   HISTORY OF PRESENT ILLNESS:  The patient is status post coronary artery  bypass grafting x3 done by Dr. Cornelius Moras on August 19, 2010.  The patient's  postoperative course was pretty much unremarkable and he was discharged  to home in stable condition.  He presents today for his 3-week followup  visit.  On presentation today, the patient feels that he is improving.  He is up ambulating half a mile to a mile per day.  He denies any chest  pain or shortness of breath with ambulation or at rest.  He feels his  strength is improving.  He is tolerating diet well.  He is sleeping well  at night.  He does have an appointment to see Dr. Antoine Poche later this  week on Wednesday.  Cardiac rehab has contacted him and he does plan to  proceed with outpatient cardiac rehab.  He is initially scheduled for  Wednesday, but may need to reschedule secondary to his appointment with  Dr. Antoine Poche.  The patient denies any fever, nausea, vomiting, opening  or drainage from any of his incision sites.  He is currently not taking  any more narcotics.   PHYSICAL EXAMINATION:  Vital Signs:  Blood pressure 105/67, pulse 100,  respirations of 18, and O2 sats 98% on room air.  Respiratory:  Clear to  auscultation bilaterally.  Cardiac:  Regular rate and rhythm.  No  murmurs, gallops, or rubs noted.  Chest:  The sternum is stable.  Abdomen:  Benign.  Bowel sounds x4.  Soft and nontender.  Extremities:  No edema noted.  Warm and well perfused.  Incisions:  All incisions are  clean, dry, and intact and healing well.   STUDIES:  The patient had a PA and lateral chest x-ray obtained today,  which shows no active cardiopulmonary disease.  There is no pneumothorax  noted.  All sternal wires intact.   IMPRESSION AND PLAN:  The patient is progressing well following  coronary  artery bypass grafting.  He will plan to see Dr. Antoine Poche on Wednesday.  He is encouraged to continue with cardiac rehab.  We will continue him  on all his current medications at this time.  The patient is instructed  it is okay for him to start driving short distances.  He is told still  no heavy lifting over 10 pounds for the next 2 months.  At this time,  the patient is stable from a surgical standpoint, we  will release him from the practice.  He will continue to follow up with  Dr. Antoine Poche.  If he has any surgical issues, he is to contact us and we  will see him again.  The patient is in agreement.   Sol Blazing, PA   KMD/MEDQ  D:  08/26/2010  T:  08/27/2010  Job:  [id number]   cc:   Rollene Rotunda, MD, [facility]  Virgina Evener Barbara Cower, MD

## 2011-03-19 IMAGING — CR DG CHEST 1V PORT
1 series · 1 of 1 positions shown · non-contrast
Comparison: Chest radiograph 07/31/2010

CLINICAL DATA: CABG

PORTABLE CHEST - 1 VIEW

[AP]
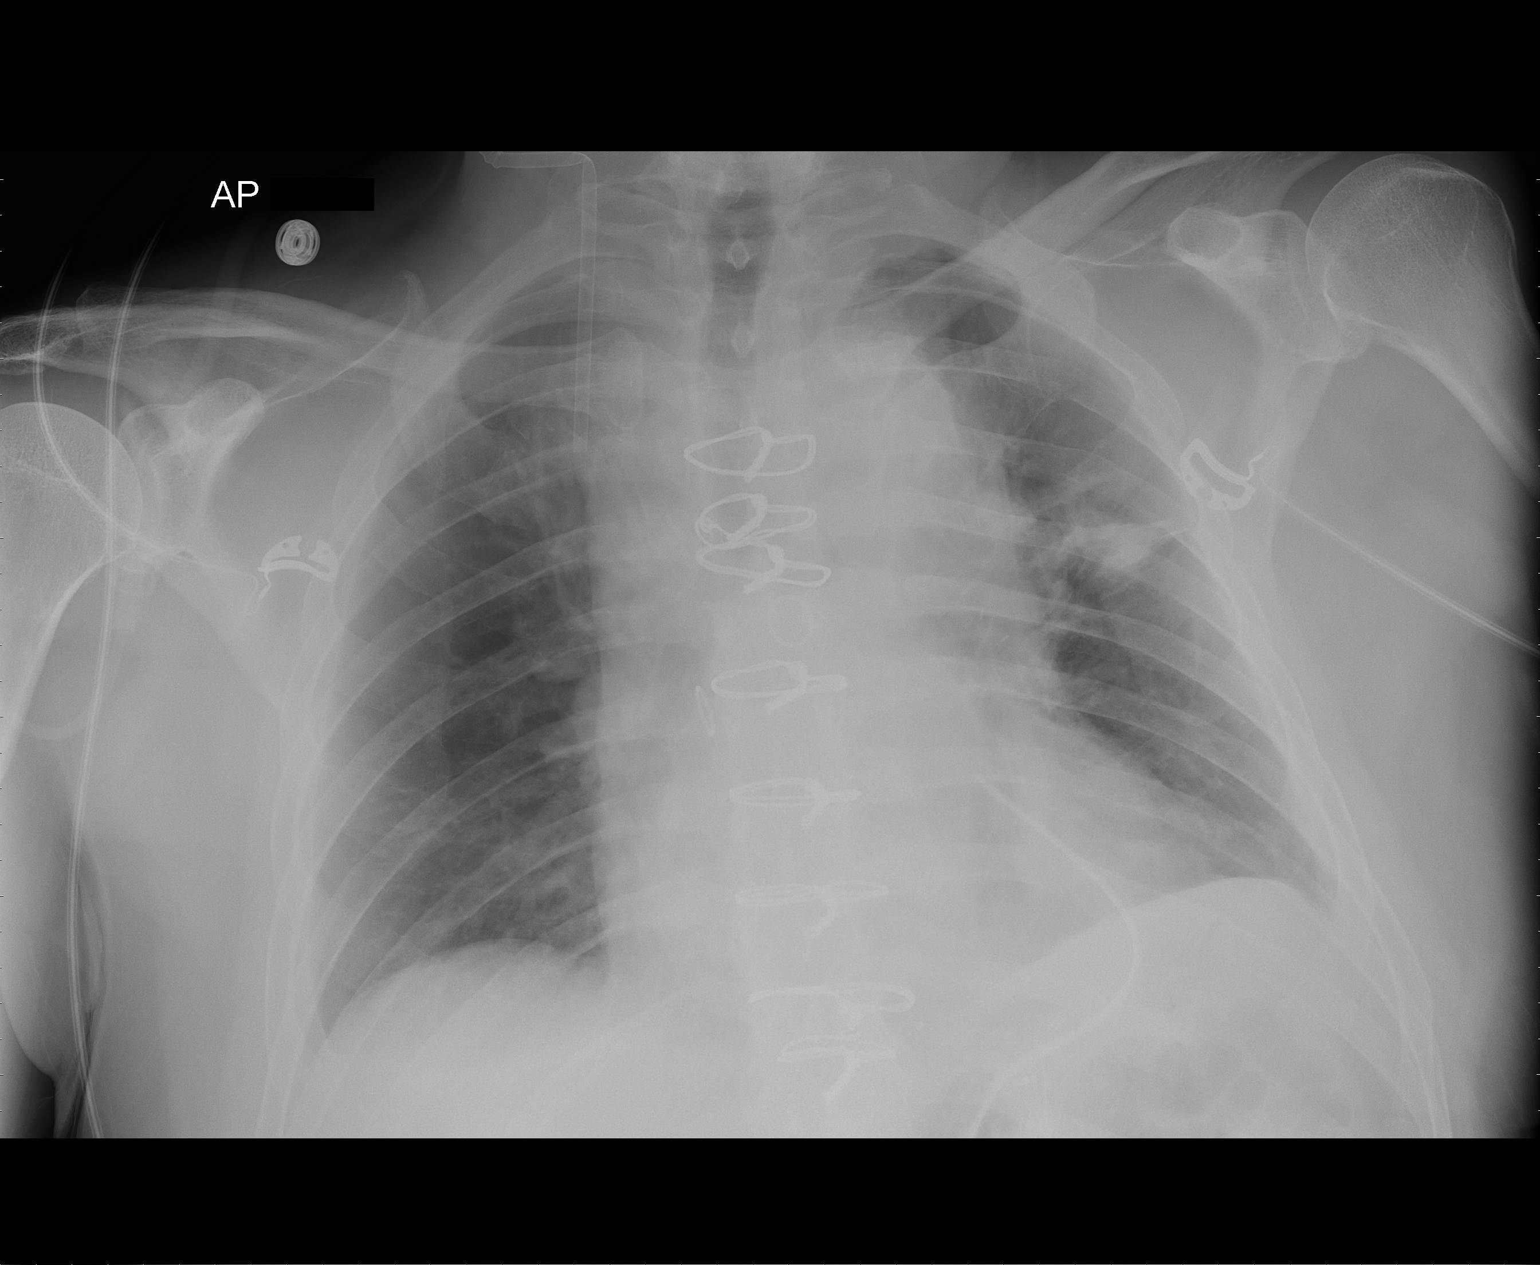

[1 of 1 positions shown; findings below may reference images not displayed]

FINDINGS: Sternotomy wires overlie stable cardiac silhouette.
Retraction of Swan-Ganz catheter with a right IJ sheath remaining.
There is a mediastinal drain unchanged.  There is interval removal
of the left chest tube with no evidence of pneumothorax.  No
evidence of pulmonary edema.  No focal consolidation.
IMPRESSION: 1.  Interval removal of left chest tube without pneumothorax.
2.  Mild atelectasis.

## 2011-04-23 ENCOUNTER — Encounter (HOSPITAL_BASED_OUTPATIENT_CLINIC_OR_DEPARTMENT_OTHER): Payer: PRIVATE HEALTH INSURANCE | Admitting: Internal Medicine

## 2011-04-23 ENCOUNTER — Ambulatory Visit (HOSPITAL_COMMUNITY)
Admission: RE | Admit: 2011-04-23 | Discharge: 2011-04-23 | Disposition: A | Discharge status: Home / Self Care | Payer: PRIVATE HEALTH INSURANCE | Source: Ambulatory Visit | Attending: Internal Medicine | Admitting: Internal Medicine

## 2011-04-23 DIAGNOSIS — Z7982 Long term (current) use of aspirin: Secondary | ICD-10-CM | POA: Insufficient documentation

## 2011-04-23 DIAGNOSIS — Z1211 Encounter for screening for malignant neoplasm of colon: Secondary | ICD-10-CM | POA: Insufficient documentation

## 2011-04-23 DIAGNOSIS — K644 Residual hemorrhoidal skin tags: Secondary | ICD-10-CM | POA: Insufficient documentation

## 2011-04-23 DIAGNOSIS — Z951 Presence of aortocoronary bypass graft: Secondary | ICD-10-CM | POA: Insufficient documentation

## 2011-05-06 NOTE — Op Note (Signed)
  NAMEESTELL, DILLINGER NO.:  [id number]  MEDICAL RECORD NO.:  [id number]  LOCATION:  [facility]                          FACILITY:  [facility]  PHYSICIAN:  Lionel December, M.D.    DATE OF BIRTH:  01/13/1951  DATE OF PROCEDURE:  04/23/2011 DATE OF DISCHARGE:                              OPERATIVE REPORT   PROCEDURE:  Colonoscopy.  Roy White is a 60 year old Caucasian male who is undergoing average risk screening colonoscopy.  His last exam was over 10 years ago.  He does not have any GI symptoms.  Procedure risks were reviewed with the patient and informed consent was obtained.  MEDICATIONS FOR CONSCIOUS SEDATION: 1. Demerol 50 mg IV. 2. Versed 4 mg IV.  FINDINGS:  Procedure performed in endoscopy suite.  The patient's vital signs and O2 sats were monitored during the procedure and remained stable.  The patient was placed in left lateral recumbent position and rectal examination performed.  No abnormality noted on external or digital exam.  Pentax videoscope was placed through rectum and advanced under vision into sigmoid colon and beyond.  Preparation was excellent involving distal half of the colon, but he had some formed stool in his proximal transverse colon, ascending colon as well as cecum.  Scope was passed into cecum.  Per washing, cecal landmarks were well identified and pictures taken of appendiceal orifice and ileocecal valve.  As the scope was slowly withdrawn, colonic mucosa was carefully examined and no polyps and/or tumor masses were noted.  Rectal mucosa was normal.  Scope was retroflexed to examine anorectal junction and small hemorrhoids noted below the dentate line.  Endoscope was withdrawn.  Withdrawal time was 10 minutes.  The patient tolerated the procedure well.  FINAL DIAGNOSES: 1. Examination performed to cecum. 2. Small external hemorrhoids, otherwise normal colonoscopy.  The exam     was somewhat compromised in the proximal colon because of  quality     of prep.  RECOMMENDATIONS: 1. Standard instructions given. 2. He should do yearly Hemoccults and consider having next screening     no later than 10 years.          ______________________________ Lionel December, M.D.     NR/MEDQ  D:  04/23/2011  T:  04/23/2011  Job:  [id number]  cc:   Lelon Perla, DO [address] [place], [place] [address]  Electronically Signed by Lionel December M.D. on 05/06/2011 09:55:37 PM

## 2011-06-11 ENCOUNTER — Telehealth: Payer: Self-pay | Admitting: *Deleted

## 2011-06-11 NOTE — Telephone Encounter (Signed)
Message copied by Arlyss Gandy on Wed Jun 11, 2011  2:42 PM ------      Message from: Roy White      Created: Wed Jun 11, 2011  2:19 PM       LDL 108, NL LFTs. No meds listed. When is pt scheduled to return to clinic.

## 2011-06-11 NOTE — Telephone Encounter (Signed)
Pt notified of results and verbalized understanding. Pt has recall for 6 mo f/u with Dr. Myrtis Ser due now. Pt scheduled for first available with Dr. Myrtis Ser on Oct 1st.  Pt states he takes Crestor 40 mg every night.

## 2011-07-25 ENCOUNTER — Encounter: Payer: Self-pay | Admitting: Cardiology

## 2011-07-27 ENCOUNTER — Encounter: Payer: Self-pay | Admitting: Cardiology

## 2011-07-27 DIAGNOSIS — R55 Syncope and collapse: Secondary | ICD-10-CM | POA: Insufficient documentation

## 2011-07-27 DIAGNOSIS — R9430 Abnormal result of cardiovascular function study, unspecified: Secondary | ICD-10-CM | POA: Insufficient documentation

## 2011-07-27 DIAGNOSIS — N200 Calculus of kidney: Secondary | ICD-10-CM | POA: Insufficient documentation

## 2011-07-27 DIAGNOSIS — I10 Essential (primary) hypertension: Secondary | ICD-10-CM | POA: Insufficient documentation

## 2011-07-27 DIAGNOSIS — I2510 Atherosclerotic heart disease of native coronary artery without angina pectoris: Secondary | ICD-10-CM | POA: Insufficient documentation

## 2011-07-27 DIAGNOSIS — Z87891 Personal history of nicotine dependence: Secondary | ICD-10-CM | POA: Insufficient documentation

## 2011-07-27 DIAGNOSIS — Z951 Presence of aortocoronary bypass graft: Secondary | ICD-10-CM | POA: Insufficient documentation

## 2011-07-27 DIAGNOSIS — E785 Hyperlipidemia, unspecified: Secondary | ICD-10-CM | POA: Insufficient documentation

## 2011-07-28 ENCOUNTER — Ambulatory Visit (INDEPENDENT_AMBULATORY_CARE_PROVIDER_SITE_OTHER): Payer: PRIVATE HEALTH INSURANCE | Admitting: Cardiology

## 2011-07-28 ENCOUNTER — Encounter: Payer: Self-pay | Admitting: Cardiology

## 2011-07-28 DIAGNOSIS — I10 Essential (primary) hypertension: Secondary | ICD-10-CM

## 2011-07-28 DIAGNOSIS — R55 Syncope and collapse: Secondary | ICD-10-CM

## 2011-07-28 DIAGNOSIS — I2510 Atherosclerotic heart disease of native coronary artery without angina pectoris: Secondary | ICD-10-CM

## 2011-07-28 MED ORDER — LISINOPRIL 10 MG PO TABS: 10.0000 mg | ORAL_TABLET | Freq: Every day | ORAL | Status: DC

## 2011-07-28 NOTE — Assessment & Plan Note (Signed)
Artery disease is stable.  No change in therapy.

## 2011-07-28 NOTE — Progress Notes (Signed)
HPI Patient is seen today for followup coronary disease.  He is doing well.  He's not had any chest pain.  He underwent CABG in October, 2011.  In December, 2011 he had some presyncope and we felt it was due to dehydration and a need for medicine adjustment.  His meds were adjusted and he's done very well.  He has not had any recurrent symptoms.  He's not had any chest pain. No Known Allergies  Current Outpatient Prescriptions  Medication Sig Dispense Refill  . aspirin 81 MG tablet Take 81 mg by mouth daily.        . rosuvastatin (CRESTOR) 40 MG tablet Take 40 mg by mouth at bedtime.          History   Social History  . Marital Status: Single    Spouse Name: N/A    Number of Children: N/A  . Years of Education: N/A   Occupational History  . Full time at [employer]    Social History Main Topics  . Smoking status: Former Smoker -- 0.3 packs/day for 10 years    Types: Cigarettes    Quit date: 10/27/2001  . Smokeless tobacco: Never Used   Comment: Off and on x 10 years. Quit 5 years ago.  . Alcohol Use: Not on file  . Drug Use: Not on file  . Sexually Active: Not on file   Other Topics Concern  . Not on file   Social History Narrative   Divorced     Family History  Problem Relation Age of Onset  . Stroke Mother   . Heart disease Neg Hx   . Coronary artery disease Neg Hx     Past Medical History  Diagnosis Date  . Hypertension   . Nephrolithiasis   . CAD (coronary artery disease)   . Dyslipidemia   . History of tobacco use   . Syncope     December, 2011, very brief episode, probable dehydration and orthostatic changes  . Ejection fraction     EF. 60%, echo, September, 2011  . Hx of CABG     October, 2011    Past Surgical History  Procedure Date  . Coronary artery bypass graft 07/2010    LIMA-LAD, SVG-OM2; SVG-PDA... EF 60%, by 2-D echo    ROS  Patient denies fever, chills, headache, sweats, rash, change in vision, change in hearing, chest pain, cough,  nausea vomiting, urinary symptoms.  All of the systems are reviewed and are negative.  PHYSICAL EXAM Patient is oriented to person time and place.  Affect is normal.  Head is atraumatic.  There is no jugular venous distention.  Lungs are clear.  Respiratory effort is not labored.  Cardiac exam reveals S1 and S2.  There are no clicks or significant murmurs.  The abdomen is soft there is no peripheral edema. Filed Vitals:   07/28/11 0850  BP: 148/98  Pulse: 63  Height: 5\' 4"  (1.626 m)  Weight: 163 lb (73.936 kg)    EKG is not done today.  ASSESSMENT & PLAN

## 2011-07-28 NOTE — Assessment & Plan Note (Signed)
Blood pressure today is elevated.  He does check his pressure at home.  He says that at times systolic is above 140.  He had previously been on metoprolol and lisinopril 20 mg.  I reviewed this with him and he is comfortable restarting lisinopril only at 10 mg daily.  I think he'll be stable with this.  I plan to see him back in 6 months.

## 2011-07-28 NOTE — Assessment & Plan Note (Signed)
Patient has not had any recurrent symptoms.  He is stable.  His blood pressure today is higher than I would like to see.  See discussion below.

## 2011-07-28 NOTE — Patient Instructions (Signed)
Your physician recommends that you schedule a follow-up appointment in: 6 months. You will receive a letter in the mail about 1-2 months in advance reminding you to call our office and schedule your appointment. If you don't receive this letter, please contact our office to schedule.  Your physician has recommended you make the following change in your medication: START LISINOPRIL 10MG . This prescription has been sent to your pharmacy.

## 2012-05-14 ENCOUNTER — Other Ambulatory Visit: Payer: Self-pay | Admitting: Cardiology

## 2012-08-04 ENCOUNTER — Other Ambulatory Visit: Payer: Self-pay | Admitting: *Deleted

## 2012-08-04 MED ORDER — LISINOPRIL 10 MG PO TABS: 10.0000 mg | ORAL_TABLET | Freq: Every day | ORAL | Status: DC

## 2012-08-04 NOTE — Telephone Encounter (Signed)
[name] needs refills on his blood pressure medications. Please call [place], [place], [place]

## 2012-09-01 ENCOUNTER — Encounter: Payer: Self-pay | Admitting: Cardiology

## 2012-09-03 ENCOUNTER — Encounter: Payer: Self-pay | Admitting: Cardiology

## 2012-09-03 ENCOUNTER — Ambulatory Visit (INDEPENDENT_AMBULATORY_CARE_PROVIDER_SITE_OTHER): Payer: PRIVATE HEALTH INSURANCE | Admitting: Cardiology

## 2012-09-03 ENCOUNTER — Encounter: Payer: Self-pay | Admitting: *Deleted

## 2012-09-03 VITALS — BP 141/93 | HR 75 | Ht 64.0 in | Wt 164.8 lb

## 2012-09-03 DIAGNOSIS — I2510 Atherosclerotic heart disease of native coronary artery without angina pectoris: Secondary | ICD-10-CM

## 2012-09-03 DIAGNOSIS — E785 Hyperlipidemia, unspecified: Secondary | ICD-10-CM

## 2012-09-03 DIAGNOSIS — R55 Syncope and collapse: Secondary | ICD-10-CM

## 2012-09-03 DIAGNOSIS — I10 Essential (primary) hypertension: Secondary | ICD-10-CM

## 2012-09-03 MED ORDER — LISINOPRIL 20 MG PO TABS: 20.0000 mg | ORAL_TABLET | Freq: Every day | ORAL | Status: DC

## 2012-09-03 NOTE — Assessment & Plan Note (Signed)
The patient is stable post CABG. 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 I will arrange for this.

## 2012-09-03 NOTE — Assessment & Plan Note (Signed)
When I saw him last week and restarted lisinopril 10 mg. He tolerates this well. His pressure is running slightly on the high side in the same is true at home. His lisinopril will be increased to 20 mg daily. I'll range for chemistry followup if he does not have any other physicians checking on him.

## 2012-09-03 NOTE — Progress Notes (Signed)
   HPI Patient is seen today for followup coronary disease. He underwent CABG in 2011. He's here for followup. He's not having any significant chest discomfort. He's going about full activities.  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  Patient was seen last in the office October, 2012. No Known Allergies  Current Outpatient Prescriptions  Medication Sig Dispense Refill  . aspirin 81 MG tablet Take 81 mg by mouth daily.        Marland Kitchen lisinopril (PRINIVIL,ZESTRIL) 10 MG tablet Take 1 tablet (10 mg total) by mouth daily.  30 tablet  1  . rosuvastatin (CRESTOR) 40 MG tablet Take 40 mg by mouth at bedtime.          History   Social History  . Marital Status: Single    Spouse Name: N/A    Number of Children: N/A  . Years of Education: N/A   Occupational History  . Full time at [employer]    Social History Main Topics  . Smoking status: Former Smoker -- 0.3 packs/day for 10 years    Types: Cigarettes    Quit date: 10/27/2001  . Smokeless tobacco: Never Used     Comment: Off and on x 10 years. Quit 5 years ago.  . Alcohol Use: Not on file  . Drug Use: Not on file  . Sexually Active: Not on file   Other Topics Concern  . Not on file   Social History Narrative   Divorced     Family History  Problem Relation Age of Onset  . Stroke Mother   . Heart disease Neg Hx   . Coronary artery disease Neg Hx     Past Medical History  Diagnosis Date  . Hypertension   . Nephrolithiasis   . CAD (coronary artery disease)   . Dyslipidemia   . History of tobacco use   . Syncope     December, 2011, very brief episode, probable dehydration and orthostatic changes  . Ejection fraction     EF. 60%, echo, September, 2011  . Hx of CABG     October, 2011    Past Surgical History  Procedure Date  . Coronary artery bypass graft 07/2010    LIMA-LAD, SVG-OM2; SVG-PDA... EF 60%, by 2-D echo    Patient Active Problem List  Diagnosis  . OBESITY, UNSPECIFIED  . MICROSCOPIC HEMATURIA  .  Hypertension  . Nephrolithiasis  . CAD (coronary artery disease)  . Dyslipidemia  . History of tobacco use  . Syncope  . Ejection fraction  . Hx of CABG    ROS  Patient denies fever, chills, headache, sweats, rash, change in vision, change in hearing, chest pain, cough, nausea vomiting, urinary symptoms. All other systems are reviewed and are negative.   PHYSICAL EXAM  Patient is oriented to person time and place. Affect is normal. There is no jugulovenous distention. Lungs are clear. Respiratory effort is nonlabored. Cardiac exam reveals S1 and S2. There no clicks or significant murmurs. The abdomen is soft. There is no peripheral edema. There are no musculoskeletal deformities. There are no skin rashes.  Filed Vitals:   09/03/12 1418  BP: 141/93  Pulse: 75  Height: 5\' 4"  (1.626 m)  Weight: 164 lb 12.8 oz (74.753 kg)   EKG is done today and reviewed by me.There are old thin inferior Q waves. There is no change from the prior tracing. ASSESSMENT & PLAN

## 2012-09-03 NOTE — Assessment & Plan Note (Signed)
Lipids are being treated. No change in therapy. 

## 2012-09-03 NOTE — Patient Instructions (Addendum)
Your physician recommends that you schedule a follow-up appointment in: 1 year. You will receive a reminder letter in the mail in about 10 months reminding you to call and schedule your appointment. If you don't receive this letter, please contact our office. Your physician has recommended you make the following change in your medication: Increased lisinopril to 20 mg daily. You may take (2) of your 10 mg daily until they are finished. Your new prescription has been sent to your pharmacy. All other medications will remain the same. Your physician recommends that you return for lab work in: today at [facility] for CBC/BMET/TSH. Your physician has requested that you have en exercise stress myoview. For further information please visit [email or web address] Please follow instruction sheet, as given.

## 2012-09-03 NOTE — Assessment & Plan Note (Signed)
The patient has not had any recurrent syncope. He had one episode In the past that was probably related to dehydration with some orthostatic issues.

## 2012-09-14 ENCOUNTER — Ambulatory Visit (HOSPITAL_COMMUNITY): Payer: PRIVATE HEALTH INSURANCE | Attending: Cardiology | Admitting: Radiology

## 2012-09-14 VITALS — BP 127/83 | HR 62 | Ht 64.0 in | Wt 163.0 lb

## 2012-09-14 DIAGNOSIS — I2510 Atherosclerotic heart disease of native coronary artery without angina pectoris: Secondary | ICD-10-CM | POA: Insufficient documentation

## 2012-09-14 MED ORDER — TECHNETIUM TC 99M SESTAMIBI GENERIC - CARDIOLITE
11.0000 | Freq: Once | INTRAVENOUS | Status: AC | PRN
  Administered 2012-09-14: 11 via INTRAVENOUS

## 2012-09-14 MED ORDER — TECHNETIUM TC 99M SESTAMIBI GENERIC - CARDIOLITE
33.0000 | Freq: Once | INTRAVENOUS | Status: AC | PRN
  Administered 2012-09-14: 33 via INTRAVENOUS

## 2012-09-14 NOTE — Progress Notes (Signed)
[facility] SITE 3 NUCLEAR MED [id number] [place] [address] [phone]  Cardiology Nuclear Med Study  Roy White is a 61 y.o. male     MRN : [id number]     DOB: [birth year]  Procedure Date: 09/14/2012  Nuclear Med Background Indication for Stress Test:  Evaluation for Ischemia, Graft Patency and DOT Physical History:  9/11 GEX:BMWUXLKG>MWN:UUVOZDGU, EF=54%>Cath>CABG, EF=55%; '11 Echo:EF=60% Cardiac Risk Factors: Family History - CAD(Not Premature), History of Smoking, Hypertension and Lipids  Symptoms:  No cardiac symptoms   Nuclear Pre-Procedure Caffeine/Decaff Intake:  None > 12 hrs NPO After: 7:00pm   Lungs:  Clear. O2 Sat: 97% on room air. IV 0.9% NS with Angio Cath:  20g  IV Site: R Antecubital x 1, tolerated well IV Started by:  Irean Hong, RN  Chest Size (in):  38 Cup Size: n/a  Height: 5\' 4"  (1.626 m)  Weight:  163 lb (73.936 kg)  BMI:  Body mass index is 27.98 kg/(m^2). Tech Comments:  AM medications taken    Nuclear Med Study 1 or 2 day study: 1 day  Stress Test Type:  Stress  Reading MD: Marca Ancona, MD  Order Authorizing Provider:  Willa Rough, MD  Resting Radionuclide: Technetium 99m Sestamibi  Resting Radionuclide Dose: 11.0 mCi   Stress Radionuclide:  Technetium 37m Sestamibi  Stress Radionuclide Dose: 33.0 mCi           Stress Protocol Rest HR: 62 Stress HR: 160  Rest BP: 127/83 Stress BP: 172/91  Exercise Time (min): 8:01 METS: 10.1   Predicted Max HR: 159 bpm % Max HR: 100.63 bpm Rate Pressure Product: 44034   Dose of Adenosine (mg):  n/a Dose of Lexiscan: n/a mg  Dose of Atropine (mg): n/a Dose of Dobutamine: n/a mcg/kg/min (at max HR)  Stress Test Technologist: Smiley Houseman, CMA-N  Nuclear Technologist:  Domenic Polite, CNMT     Rest Procedure:  Myocardial perfusion imaging was performed at rest 45 minutes following the intravenous administration of Technetium 39m Tetrofosmin. Rest ECG:  Prior IWMI with nonspecific ST-T wave changes.  Stress Procedure:  The patient exercised on the treadmill utilizing the Bruce protocol for 8:01 minutes. He then stopped due to fatigue and denied any chest pain.  There were marked ST-T wave changes with occasional PVC and PAC's.  Technetium 44m Sestamibi was injected at peak exercise and myocardial perfusion imaging was performed after a brief delay.  EKG's were discussed with Dr. Eden Emms, DOD, and he felt it was safe for the patient to follow up with Dr. Myrtis Ser.  Stress ECG: Insignificant changes from baseline.   QPS Raw Data Images:  Normal; no motion artifact; normal heart/lung ratio. Stress Images:  Small, mild basal to mid inferolateral perfusion defect.  Rest Images:  Small, mild basal to mid inferolateral perfusion defect, less marked than the stress images.  Subtraction (SDS):  Small, mild partially reversible basal to mid inferolateral perfusion defect. Transient Ischemic Dilatation (Normal <1.22):  0.87 Lung/Heart Ratio (Normal <0.45):  0.30  Quantitative Gated Spect Images QGS EDV:  74 ml QGS ESV:  33 ml  Impression Exercise Capacity:  Fair exercise capacity. BP Response:  Normal blood pressure response. Clinical Symptoms:  Dyspnea, fatigue.  ECG Impression:  Baseline inferior and V5/V6 slight ST depression with T wave inversion.  There was nonsignificant additional ST depression in these leads with exertion.  Comparison with Prior Nuclear Study: No images to compare  Overall Impression:  Low risk stress nuclear  study.  There is a small, mild partially reversible basal to mid inferolateral perfusion defect.  This suggests a small area of ischemia.   LV Ejection Fraction: 56%.  LV Wall Motion:  Septal hypokinesis, this may be a post-op finding.   Marca Ancona 09/14/2012

## 2012-09-21 ENCOUNTER — Encounter: Payer: Self-pay | Admitting: Cardiology

## 2012-09-21 ENCOUNTER — Telehealth: Payer: Self-pay | Admitting: *Deleted

## 2012-09-21 NOTE — Telephone Encounter (Signed)
Called to [facility] lab and spoke with Iantha Fallen and [place] r/e missing TSH. Marcelino Duster states that registration at [facility] didn't send order back with patient during that day and she will call patient and inform him that another specimen needed to be obtained to get TSH level.

## 2012-09-21 NOTE — Progress Notes (Signed)
   I saw the patient recently to reassess cardiac status. He is doing very well.  A stress nuclear scan was done on September 14, 2012. This was done together more information.  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  This study is a low risk study. There is one very small area of ischemia. Further workup is not needed. The ejection fraction is 56%, normal.  The patient has a LOW RISK stress test. 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  Jerral Bonito, MD

## 2012-09-22 ENCOUNTER — Telehealth: Payer: Self-pay | Admitting: *Deleted

## 2012-09-22 NOTE — Telephone Encounter (Signed)
Message copied by Eustace Moore on Wed Sep 22, 2012  8:20 AM ------      Message from: [place], [place] D      Created: Tue Sep 21, 2012  4:35 PM       I have dictated a short documentation note today about this stress test. 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 Please speak with the patient and see what format he needs. My brief documentation note should go with anything that we send or give to him

## 2012-09-22 NOTE — Telephone Encounter (Signed)
Patient informed and requests the letter/documentation be mailed to his home and he will take to DOT evaluation.

## 2012-09-22 NOTE — Telephone Encounter (Signed)
Left message for patient to call office.  

## 2012-09-29 ENCOUNTER — Telehealth: Payer: Self-pay | Admitting: *Deleted

## 2012-09-29 NOTE — Telephone Encounter (Signed)
Patient informed. 

## 2012-09-29 NOTE — Telephone Encounter (Signed)
Message copied by Eustace Moore on Wed Sep 29, 2012  3:05 PM ------      Message from: [place], [place] D      Created: Tue Sep 28, 2012  1:39 PM       Labs from 2 times in November look good

## 2013-05-10 ENCOUNTER — Encounter (HOSPITAL_COMMUNITY): Payer: Self-pay | Admitting: Emergency Medicine

## 2013-05-10 ENCOUNTER — Emergency Department (HOSPITAL_COMMUNITY)
Admission: EM | Admit: 2013-05-10 | Discharge: 2013-05-10 | Disposition: A | Discharge status: Home / Self Care | Payer: 59 | Attending: Emergency Medicine | Admitting: Emergency Medicine

## 2013-05-10 ENCOUNTER — Emergency Department (HOSPITAL_COMMUNITY): Payer: 59

## 2013-05-10 DIAGNOSIS — I10 Essential (primary) hypertension: Secondary | ICD-10-CM | POA: Insufficient documentation

## 2013-05-10 DIAGNOSIS — Z87891 Personal history of nicotine dependence: Secondary | ICD-10-CM | POA: Insufficient documentation

## 2013-05-10 DIAGNOSIS — R42 Dizziness and giddiness: Secondary | ICD-10-CM | POA: Insufficient documentation

## 2013-05-10 DIAGNOSIS — Z79899 Other long term (current) drug therapy: Secondary | ICD-10-CM | POA: Insufficient documentation

## 2013-05-10 DIAGNOSIS — Z862 Personal history of diseases of the blood and blood-forming organs and certain disorders involving the immune mechanism: Secondary | ICD-10-CM | POA: Insufficient documentation

## 2013-05-10 DIAGNOSIS — Z87442 Personal history of urinary calculi: Secondary | ICD-10-CM | POA: Insufficient documentation

## 2013-05-10 DIAGNOSIS — E860 Dehydration: Secondary | ICD-10-CM

## 2013-05-10 DIAGNOSIS — Z8639 Personal history of other endocrine, nutritional and metabolic disease: Secondary | ICD-10-CM | POA: Insufficient documentation

## 2013-05-10 DIAGNOSIS — Z951 Presence of aortocoronary bypass graft: Secondary | ICD-10-CM | POA: Insufficient documentation

## 2013-05-10 DIAGNOSIS — I2510 Atherosclerotic heart disease of native coronary artery without angina pectoris: Secondary | ICD-10-CM | POA: Insufficient documentation

## 2013-05-10 DIAGNOSIS — R55 Syncope and collapse: Secondary | ICD-10-CM | POA: Insufficient documentation

## 2013-05-10 DIAGNOSIS — Z7982 Long term (current) use of aspirin: Secondary | ICD-10-CM | POA: Insufficient documentation

## 2013-05-10 LAB — BASIC METABOLIC PANEL
BUN: 24 mg/dL — ABNORMAL HIGH (ref 6–23)
CO2: 25 mEq/L (ref 19–32)
Calcium: 9 mg/dL (ref 8.4–10.5)
Glucose, Bld: 101 mg/dL — ABNORMAL HIGH (ref 70–99)
Sodium: 140 mEq/L (ref 135–145)

## 2013-05-10 LAB — CBC WITH DIFFERENTIAL/PLATELET
Eosinophils Relative: 1 % (ref 0–5)
HCT: 42.8 % (ref 39.0–52.0)
Hemoglobin: 14.6 g/dL (ref 13.0–17.0)
Lymphocytes Relative: 13 % (ref 12–46)
Lymphs Abs: 2.3 10*3/uL (ref 0.7–4.0)
MCV: 86.5 fL (ref 78.0–100.0)
Monocytes Relative: 5 % (ref 3–12)
Platelets: 236 10*3/uL (ref 150–400)
RBC: 4.95 MIL/uL (ref 4.22–5.81)
WBC: 17.1 10*3/uL — ABNORMAL HIGH (ref 4.0–10.5)

## 2013-05-10 MED ORDER — SODIUM CHLORIDE 0.9 % IV BOLUS (SEPSIS)
1000.0000 mL | Freq: Once | INTRAVENOUS | Status: AC
  Administered 2013-05-10: 1000 mL via INTRAVENOUS

## 2013-05-10 NOTE — ED Provider Notes (Signed)
History  This chart was scribed for Benny Lennert, MD, by Candelaria Stagers, ED Scribe. This patient was seen in room APA02/APA02 and the patient's care was started at 9:21 PM  CSN: [id number] Arrival date & time 05/10/13  2044  First MD Initiated Contact with Patient 05/10/13 2118     Chief Complaint  Patient presents with  . Near Syncope   The history is provided by the patient. No language interpreter was used.   HPI Comments: Roy White is a 62 y.o. male who presents to the Emergency Department complaining of a near syncopal episode that occurred about 45 minutes prior to arrival.  Pt states he became dizzy and fell from standing.  He reports getting overheated today while at work.  He has no other sx.  Nothing seems to worsen or improve sx.     Past Medical History  Diagnosis Date  . Hypertension   . Nephrolithiasis   . CAD (coronary artery disease)   . Dyslipidemia   . History of tobacco use   . Syncope     December, 2011, very brief episode, probable dehydration and orthostatic changes  . Ejection fraction     EF. 60%, echo, September, 2011  . 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 Coronary artery bypass graft  07/2010    LIMA-LAD, SVG-OM2; SVG-PDA... EF 60%, by 2-D echo   Family History  Problem Relation Age of Onset  . Stroke Mother   . Heart disease Neg Hx   . Coronary artery disease Neg Hx    History  Substance Use Topics  . Smoking status: Former Smoker -- 0.30 packs/day for 10 years    Types: Cigarettes    Quit date: 10/27/2001  . Smokeless tobacco: Never Used     Comment: Off and on x 10 years. Quit 5 years ago.  . Alcohol Use: Yes     Comment: occasionally    Review of Systems  Neurological: Positive for dizziness.  All other systems reviewed and are negative.    Allergies  Review of patient's allergies indicates no known allergies.  Home Medications   Current Outpatient Rx  Name  Route   Sig  Dispense  Refill  . aspirin 81 MG tablet   Oral   Take 81 mg by mouth daily.           Marland Kitchen lisinopril (PRINIVIL,ZESTRIL) 20 MG tablet   Oral   Take 1 tablet (20 mg total) by mouth daily.   90 tablet   3     Dose increase    BP 117/97  Pulse 77  Temp(Src) 98.3 F (36.8 C) (Oral)  Resp 16  Ht 5\' 4"  (1.626 m)  Wt 150 lb (68.04 kg)  BMI 25.73 kg/m2  SpO2 100% Physical Exam  Nursing note and vitals reviewed. Constitutional: He is oriented to person, place, and time. He appears well-developed and well-nourished. No distress.  HENT:  Head: Normocephalic and atraumatic.  Eyes: EOM are normal.  Neck: Neck supple. No tracheal deviation present.  Cardiovascular: Normal rate.   Pulmonary/Chest: Effort normal. No respiratory distress.  Musculoskeletal: Normal range of motion.  Neurological: He is alert and oriented to person, place, and time.  Skin: Skin is warm and dry.  Psychiatric: He has a normal mood and affect. His behavior is normal.    ED Course  Procedures  DIAGNOSTIC STUDIES: Oxygen Saturation is 100% on room  air, normal by my interpretation.    COORDINATION OF CARE:  9:24 PM Discussed course of care with pt who understands and agrees.   Labs Reviewed - No data to display No results found. No diagnosis found.  Date: 05/10/2013  Rate:71  Rhythm: normal sinus rhythm  QRS Axis: normal  Intervals: normal  ST/T Wave abnormalities: nonspecific ST changes  Conduction Disutrbances:none  Narrative Interpretation:   Old EKG Reviewed: none available   MDM  Dehydration and near syncope The chart was scribed for me under my direct supervision.  I personally performed the history, physical, and medical decision making and all procedures in the evaluation of this patient.Benny Lennert, MD 05/10/13 [phone]

## 2013-05-10 NOTE — ED Notes (Signed)
MD at bedside. 

## 2013-05-10 NOTE — ED Notes (Addendum)
Patient had near syncopal episode about 45 minutes ago. States "I just did not feel well and started to pass out but I didn't go completely out." Per EMS - patient B/P 70/40 en route to ED. Patient received one liter normal saline by EMS.

## 2013-10-09 ENCOUNTER — Encounter: Payer: Self-pay | Admitting: Cardiology

## 2013-10-11 ENCOUNTER — Encounter: Payer: Self-pay | Admitting: Cardiology

## 2013-10-11 ENCOUNTER — Ambulatory Visit (INDEPENDENT_AMBULATORY_CARE_PROVIDER_SITE_OTHER): Payer: 59 | Admitting: Cardiology

## 2013-10-11 ENCOUNTER — Other Ambulatory Visit: Payer: Self-pay | Admitting: Cardiology

## 2013-10-11 VITALS — BP 164/96 | HR 64 | Ht 64.0 in | Wt 162.0 lb

## 2013-10-11 DIAGNOSIS — E785 Hyperlipidemia, unspecified: Secondary | ICD-10-CM

## 2013-10-11 DIAGNOSIS — I10 Essential (primary) hypertension: Secondary | ICD-10-CM

## 2013-10-11 DIAGNOSIS — I2510 Atherosclerotic heart disease of native coronary artery without angina pectoris: Secondary | ICD-10-CM

## 2013-10-11 MED ORDER — LISINOPRIL 20 MG PO TABS: 20.0000 mg | ORAL_TABLET | Freq: Every day | ORAL | Status: DC

## 2013-10-11 NOTE — Progress Notes (Signed)
I have seen the patient in the office today. There is a complete office note. His cardiac status is stable. He had a stress test in November, 2013. This was low risk with no significant abnormality. He is stable currently. His overall cardiac status is stable.  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  Jerral Bonito, MD

## 2013-10-11 NOTE — Assessment & Plan Note (Signed)
He needs a new prescription for his statin. He had been on Crestor. I will put him on Lipitor 40.

## 2013-10-11 NOTE — Assessment & Plan Note (Addendum)
Coronary disease is stable. His nuclear scan in November, 2013 revealed normal ejection fraction. It was a low risk scan.  For purposes of Department of Transportation, the patient's cardiac status is stable. He is safe to drive.

## 2013-10-11 NOTE — Patient Instructions (Signed)
Your physician recommends that you schedule a follow-up appointment in: 1 year. You will receive a reminder letter in the mail in about 10 months reminding you to call and schedule your appointment. If you don't receive this letter, please contact our office. Your physician recommends that you continue on your current medications as directed. Please refer to the Current Medication list given to you today. Please call Dr. Avon Gully to schedule an appointment. [phone]

## 2013-10-11 NOTE — Progress Notes (Signed)
HPI  Patient is seen today for followup coronary disease. I saw him last November, 2013. He had a nuclear study at that time there was low risk. He's been quite stable. He's not having any significant chest pain. He did have one visit to the emergency room for some dizziness. It is very clear that he was dehydrated. With his fluids normalized he was fine and he has felt fine since. This occurred in July, 2014.    No Known Allergies  Current Outpatient Prescriptions  Medication Sig Dispense Refill  . aspirin 81 MG tablet Take 81 mg by mouth daily.        Marland Kitchen lisinopril (PRINIVIL,ZESTRIL) 20 MG tablet Take 1 tablet (20 mg total) by mouth daily.  90 tablet  3   No current facility-administered medications for this visit.    History   Social History  . Marital Status: Single    Spouse Name: N/A    Number of Children: N/A  . Years of Education: N/A   Occupational History  . Full time at [employer]    Social History Main Topics  . Smoking status: Former Smoker -- 0.30 packs/day for 10 years    Types: Cigarettes    Quit date: 10/27/2001  . Smokeless tobacco: Never Used     Comment: Off and on x 10 years. Quit 5 years ago.  . Alcohol Use: Yes     Comment: occasionally  . Drug Use: No  . Sexual Activity: Not on file   Other Topics Concern  . Not on file   Social History Narrative   Divorced     Family History  Problem Relation Age of Onset  . Stroke Mother   . Heart disease Neg Hx   . Coronary artery disease Neg Hx     Past Medical History  Diagnosis Date  . Hypertension   . Nephrolithiasis   . CAD (coronary artery disease)   . Dyslipidemia   . History of tobacco use   . Syncope     December, 2011, very brief episode, probable dehydration and orthostatic changes  . Ejection fraction     EF. 60%, echo, September, 2011  . Hx of CABG     October, 2011    Past Surgical History  Procedure Laterality Date  . Coronary artery bypass graft  07/2010   LIMA-LAD, SVG-OM2; SVG-PDA... EF 60%, by 2-D echo    Patient Active Problem List   Diagnosis Date Noted  . Hypertension   . Nephrolithiasis   . CAD (coronary artery disease)   . Dyslipidemia   . History of tobacco use   . Syncope   . Ejection fraction   . Hx of CABG   . OBESITY, UNSPECIFIED 07/05/2010  . MICROSCOPIC HEMATURIA 07/05/2010    ROS   Patient denies fever, chills, headache, sweats, rash, change in vision, change in hearing, chest pain, cough, nausea vomiting, urinary symptoms. All other systems are reviewed and are negative.  PHYSICAL EXAM   Patient is oriented to person time and place. Affect is normal. There is no jugulovenous distention. Lungs are clear. Respiratory effort is not labored. Cardiac exam reveals S1 and S2. There no clicks or significant murmurs. Abdomen is soft. There's no peripheral edema. There no musculoskeletal deformities. There are no skin rashes.  Filed Vitals:   10/11/13 1435  BP: 164/96  Pulse: 64  Height: 5\' 4"  (1.626 m)  Weight: 162 lb (73.483 kg)    I reviewed his EKG from  earlier this year. His old nonspecific ST-T wave changes. There was no significant change. There was normal sinus rhythm.  ASSESSMENT & PLAN

## 2013-10-11 NOTE — Assessment & Plan Note (Signed)
Overall his blood pressure is controlled. 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

## 2013-10-21 ENCOUNTER — Emergency Department (HOSPITAL_COMMUNITY): Payer: 59

## 2013-10-21 ENCOUNTER — Telehealth: Payer: Self-pay | Admitting: Cardiology

## 2013-10-21 ENCOUNTER — Emergency Department (HOSPITAL_COMMUNITY)
Admission: EM | Admit: 2013-10-21 | Discharge: 2013-10-21 | Disposition: A | Discharge status: Home / Self Care | Payer: 59 | Attending: Emergency Medicine | Admitting: Emergency Medicine

## 2013-10-21 ENCOUNTER — Encounter (HOSPITAL_COMMUNITY): Payer: Self-pay | Admitting: Emergency Medicine

## 2013-10-21 DIAGNOSIS — Z79899 Other long term (current) drug therapy: Secondary | ICD-10-CM | POA: Insufficient documentation

## 2013-10-21 DIAGNOSIS — Z951 Presence of aortocoronary bypass graft: Secondary | ICD-10-CM | POA: Insufficient documentation

## 2013-10-21 DIAGNOSIS — Z87891 Personal history of nicotine dependence: Secondary | ICD-10-CM | POA: Insufficient documentation

## 2013-10-21 DIAGNOSIS — R002 Palpitations: Secondary | ICD-10-CM

## 2013-10-21 DIAGNOSIS — Z862 Personal history of diseases of the blood and blood-forming organs and certain disorders involving the immune mechanism: Secondary | ICD-10-CM | POA: Insufficient documentation

## 2013-10-21 DIAGNOSIS — Z7982 Long term (current) use of aspirin: Secondary | ICD-10-CM | POA: Insufficient documentation

## 2013-10-21 DIAGNOSIS — I2510 Atherosclerotic heart disease of native coronary artery without angina pectoris: Secondary | ICD-10-CM | POA: Insufficient documentation

## 2013-10-21 DIAGNOSIS — I10 Essential (primary) hypertension: Secondary | ICD-10-CM | POA: Insufficient documentation

## 2013-10-21 DIAGNOSIS — Z87442 Personal history of urinary calculi: Secondary | ICD-10-CM | POA: Insufficient documentation

## 2013-10-21 DIAGNOSIS — Z8639 Personal history of other endocrine, nutritional and metabolic disease: Secondary | ICD-10-CM | POA: Insufficient documentation

## 2013-10-21 LAB — CBC WITH DIFFERENTIAL/PLATELET
Basophils Absolute: 0.1 10*3/uL (ref 0.0–0.1)
Basophils Relative: 1 % (ref 0–1)
Eosinophils Absolute: 0.2 10*3/uL (ref 0.0–0.7)
Eosinophils Relative: 2 % (ref 0–5)
HCT: 45.7 % (ref 39.0–52.0)
Hemoglobin: 15.7 g/dL (ref 13.0–17.0)
Lymphocytes Relative: 25 % (ref 12–46)
Lymphs Abs: 2.2 10*3/uL (ref 0.7–4.0)
MCH: 29.3 pg (ref 26.0–34.0)
MCHC: 34.4 g/dL (ref 30.0–36.0)
MCV: 85.3 fL (ref 78.0–100.0)
Monocytes Absolute: 0.5 10*3/uL (ref 0.1–1.0)
Monocytes Relative: 6 % (ref 3–12)
Neutro Abs: 5.8 10*3/uL (ref 1.7–7.7)
Neutrophils Relative %: 66 % (ref 43–77)
Platelets: 218 10*3/uL (ref 150–400)
RBC: 5.36 MIL/uL (ref 4.22–5.81)
RDW: 12.9 % (ref 11.5–15.5)
WBC: 8.7 10*3/uL (ref 4.0–10.5)

## 2013-10-21 LAB — BASIC METABOLIC PANEL
BUN: 15 mg/dL (ref 6–23)
CO2: 22 mEq/L (ref 19–32)
Calcium: 9.2 mg/dL (ref 8.4–10.5)
Chloride: 103 mEq/L (ref 96–112)
Creatinine, Ser: 1.3 mg/dL (ref 0.50–1.35)
GFR calc Af Amer: 66 mL/min — ABNORMAL LOW (ref >90)
GFR calc non Af Amer: 57 mL/min — ABNORMAL LOW (ref >90)
Glucose, Bld: 122 mg/dL — ABNORMAL HIGH (ref 70–99)
Potassium: 4.1 mEq/L (ref 3.5–5.1)
Sodium: 139 mEq/L (ref 135–145)

## 2013-10-21 LAB — TROPONIN I: Troponin I: <0.3 ng/mL (ref <0.30)

## 2013-10-21 NOTE — ED Notes (Signed)
Patient is in no apparent distress at this time. Male companion is at the bedside. Patient reports: -CP starting last night -pain is tight in nature to the (L) chest -pain is rated 7-8/10 -there is intermittent burping

## 2013-10-21 NOTE — Telephone Encounter (Signed)
Roy White called and states his BP is staying around 165/90 for the past several days. Patient states that he feels like his Heart continues to race. Tightness in chest last evening around 9 pm. Roy White decided that he feels like he needs to go To the ER. Patient requested [name]. Advised patient that would be fine.

## 2013-10-21 NOTE — ED Notes (Signed)
Pt with left sided chest tightness that started last night

## 2013-10-21 NOTE — ED Provider Notes (Signed)
CSN: [id number]     Arrival date & time 10/21/13  1305 History  This chart was scribed for Raeford Razor, MD, by Yevette Edwards, ED Scribe. This patient was seen in room APA11/APA11 and the patient's care was started at 1:27 PM. First MD Initiated Contact with Patient 10/21/13 1323     Chief Complaint  Patient presents with  . Chest Pain   The history is provided by the patient and the spouse. No language interpreter was used.   HPI Comments: Roy White is a 62 y.o. male, with a h/o CAD and HTN, who presents to the Emergency Department complaining of centrally-located palpitations and "tightness"  began yesterday evening while he was watching tc and  lasted for a few hours. The pt characterizes the chest discomfort as "heart-racing." At bedside, the pt reports the sensation of his "heart-racing" had subsided this morning; however, he was encouraged by his cardiologist, Dr. Myrtis Ser, to visit the ED today. He reports he took a second dose of Lisinopril a few hours after the symptoms started yesterday. He usually only takes one dose a day. He denies noticing any factors which worsened the chest pain, and he believes the Lisinopril improved his symptoms.  The pt is also concerned that his BP was elevated today; in the ED, his BP is 127/785. He denies any SOB, nausea, diaphoresis, extremity swelling, cough, fever, hematochezia, or  hematuraia.  The pt reports he had not had similar symptoms since his triple bypass. He states prior to his diagnosis of CAD, he experienced frequent SOB and episodes of syncope. He reports he had a triple bypass approximately three years ago, and he routinely checks his BP since the surgery. He denies a h/o Atrial fibrillation. The pt is a former smoker.   Past Medical History  Diagnosis Date  . Hypertension   . Nephrolithiasis   . CAD (coronary artery disease)   . Dyslipidemia   . History of tobacco use   . Syncope     December, 2011, very brief episode, probable  dehydration and orthostatic changes  . Ejection fraction     EF. 60%, echo, September, 2011  . 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 Coronary artery bypass graft  07/2010    LIMA-LAD, SVG-OM2; SVG-PDA... EF 60%, by 2-D echo   Family History  Problem Relation Age of Onset  . Stroke Mother   . Heart disease Neg Hx   . Coronary artery disease Neg Hx    History  Substance Use Topics  . Smoking status: Former Smoker -- 0.30 packs/day for 10 years    Types: Cigarettes    Quit date: 10/27/2001  . Smokeless tobacco: Never Used     Comment: Off and on x 10 years. Quit 5 years ago.  . Alcohol Use: Yes     Comment: occasionally    Review of Systems  Constitutional: Negative for fever and diaphoresis.  Respiratory: Positive for chest tightness. Negative for cough and shortness of breath.   Cardiovascular: Positive for chest pain and palpitations. Negative for leg swelling.  Gastrointestinal: Negative for nausea and blood in stool.  Genitourinary: Negative for hematuria.  All other systems reviewed and are negative.    Allergies  Review of patient's allergies indicates no known allergies.  Home Medications   Current Outpatient Rx  Name  Route  Sig  Dispense  Refill  . aspirin 81 MG tablet   Oral  Take 81 mg by mouth daily.           Marland Kitchen lisinopril (PRINIVIL,ZESTRIL) 20 MG tablet   Oral   Take 1 tablet (20 mg total) by mouth daily.   90 tablet   3     Dose increase    BP 127/75  Pulse 62  Temp(Src) 97.5 F (36.4 C) (Oral)  Resp 18  Ht 5\' 4"  (1.626 m)  Wt 163 lb (73.936 kg)  BMI 27.97 kg/m2  SpO2 97%  Physical Exam  Nursing note and vitals reviewed. Constitutional: He is oriented to person, place, and time. He appears well-developed and well-nourished. No distress.  HENT:  Head: Normocephalic and atraumatic.  Eyes: EOM are normal.  Neck: Neck supple. No tracheal deviation present.  Cardiovascular: Normal rate  and regular rhythm.   No murmur heard. Pulmonary/Chest: Effort normal. No respiratory distress.  Musculoskeletal: Normal range of motion. He exhibits no edema.  Neurological: He is alert and oriented to person, place, and time.  Skin: Skin is warm and dry.  Psychiatric: He has a normal mood and affect. His behavior is normal.    ED Course  Procedures (including critical care time)  DIAGNOSTIC STUDIES: Oxygen Saturation is 97% on room air, normal by my interpretation.    COORDINATION OF CARE:  1:36 PM- Discussed treatment plan with patient, and the patient agreed to the plan.   Labs Review Labs Reviewed  BASIC METABOLIC PANEL - Abnormal; Notable for the following:    Glucose, Bld 122 (*)    GFR calc non Af Amer 57 (*)    GFR calc Af Amer 66 (*)    All other components within normal limits  CBC WITH DIFFERENTIAL  TROPONIN I   Imaging Review Dg Chest Portable 1 View  10/21/2013   CLINICAL DATA:  Chest pain  EXAM: PORTABLE CHEST - 1 VIEW  COMPARISON:  May 10, 2013  FINDINGS: There is no edema or consolidation. Heart is upper normal in size with normal pulmonary vascularity. No adenopathy. No pneumothorax. No bone lesions. Patient is status post coronary artery bypass grafting.  IMPRESSION: No edema or consolidation.   Electronically Signed   By: Bretta Bang M.D.   On: 10/21/2013 13:28    EKG Interpretation    Date/Time:  Friday October 21 2013 13:10:58 EST Ventricular Rate:  60 PR Interval:  126 QRS Duration: 80 QT Interval:  390 QTC Calculation: 390 R Axis:   56 Text Interpretation:  Normal sinus rhythm Left atrial enlargement Nonspecific ST and T wave abnormality Abnormal ECG , but stable from previous EKG from 04/2013 Confirmed by Lise Pincus  MD, Thania Woodlief ([id number]) on 10/21/2013 2:30:53 PM            MDM   1. Palpitations    62yM with palpitations last night. Atypical for ACS and normal troponin more than 12 hours after symptom onset. EKG abnormal, but stable  from previous one from 04/2013. Sinus rhythm. May benefit from holter monitor is has recurrent symptoms. Labs and CXR unremarkable. Doubt PE, infectious or dissection. Return precautions discussed. Outpt FU otherwise.   I personally preformed the services scribed in my presence. The recorded information has been reviewed is accurate. Raeford Razor, MD.     Raeford Razor, MD 10/21/13 1435

## 2014-02-21 ENCOUNTER — Ambulatory Visit: Payer: 59 | Admitting: Cardiology

## 2015-02-26 ENCOUNTER — Ambulatory Visit (INDEPENDENT_AMBULATORY_CARE_PROVIDER_SITE_OTHER): Payer: No Typology Code available for payment source | Admitting: Cardiology

## 2015-02-26 ENCOUNTER — Encounter: Payer: Self-pay | Admitting: Cardiology

## 2015-02-26 ENCOUNTER — Encounter: Payer: Self-pay | Admitting: *Deleted

## 2015-02-26 VITALS — BP 155/84 | HR 62 | Ht 64.0 in | Wt 156.0 lb

## 2015-02-26 DIAGNOSIS — Z0289 Encounter for other administrative examinations: Secondary | ICD-10-CM

## 2015-02-26 DIAGNOSIS — R9431 Abnormal electrocardiogram [ECG] [EKG]: Secondary | ICD-10-CM | POA: Diagnosis not present

## 2015-02-26 DIAGNOSIS — I10 Essential (primary) hypertension: Secondary | ICD-10-CM

## 2015-02-26 DIAGNOSIS — E785 Hyperlipidemia, unspecified: Secondary | ICD-10-CM

## 2015-02-26 DIAGNOSIS — I2510 Atherosclerotic heart disease of native coronary artery without angina pectoris: Secondary | ICD-10-CM

## 2015-02-26 MED ORDER — LISINOPRIL 20 MG PO TABS: 20.0000 mg | ORAL_TABLET | Freq: Every day | ORAL | Status: DC

## 2015-02-26 MED ORDER — ATORVASTATIN CALCIUM 40 MG PO TABS: 40.0000 mg | ORAL_TABLET | Freq: Every day | ORAL | Status: DC

## 2015-02-26 NOTE — Assessment & Plan Note (Signed)
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 He is stable. He is not having any cardiac symptoms. His EKG shows no new changes. He will be restarted on his blood pressure medication. He will be scheduled for a stress nuclear scan. He has resting EKG abnormalities. For this reason a standard treadmill will not provide adequate information. I'm arranging for a nuclear stress study. 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 We will send all of the information to the appropriate place.  As part of today's evaluation, I spent greater than 25 minutes with his total care. More than half of this time has been with direct contact with the patient discussing the importance of his medicines and making plans for his DOT nuclear stress test.

## 2015-02-26 NOTE — Patient Instructions (Signed)
Your physician has recommended you make the following change in your medication:  Start atorvastatin (lipitor) 40 mg daily at bedtime. Restart lisinopril 20 mg daily. Continue aspirin the same. Your physician has requested that you have en exercise stress myoview. For further information please visit [email or web address]. Please follow instruction sheet, as given. Your physician recommends that you schedule a follow-up appointment in: 1 year. You will receive a reminder letter in the mail in about 10 months reminding you to call and schedule your appointment. If you don't receive this letter, please contact our office.

## 2015-02-26 NOTE — Assessment & Plan Note (Signed)
The patient has known coronary disease. He underwent bypass surgery in 2011. His last stress test was November, 2013. This was low risk with a very small area of ischemia at the apex. 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

## 2015-02-26 NOTE — Assessment & Plan Note (Signed)
The patient has known coronary disease. He is not on a statin at this time. I explained to him the importance of being on a statin. There is no proof in the past that he had any difficulties with a statin. He will be started on atorvastatin 40 mg daily.

## 2015-02-26 NOTE — Assessment & Plan Note (Signed)
Systolic blood pressure is mildly elevated today. He had run out of his medications. His meds will be restarted today.

## 2015-02-26 NOTE — Progress Notes (Signed)
Cardiology Office Note   Date:  02/26/2015   ID:  Roy SalmBobby D Gura, DOB 06/14/1951, MRN [id number]  PCP:  Default, Provider, MD  Cardiologist:  Willa RoughJeffrey Katz, MD   Chief Complaint  Patient presents with  . Appointment    Follow-up coronary disease and reassess for the DOT.      History of Present Illness: Roy White is a 64 y.o. 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 He is stable. He underwent bypass surgery in 2011. He has not had any recurrent chest pain or significant shortness of breath. He is fully active. He ran out of his medications. His systolic blood pressure is mildly elevated today.    Past Medical History  Diagnosis Date  . Hypertension   . Nephrolithiasis   . CAD (coronary artery disease)   . Dyslipidemia   . History of tobacco use   . Syncope     December, 2011, very brief episode, probable dehydration and orthostatic changes  . Ejection fraction     EF. 60%, echo, September, 2011  . Hx of CABG     October, 2011    Past Surgical History  Procedure Laterality Date  . Coronary artery bypass graft  07/2010    LIMA-LAD, SVG-OM2; SVG-PDA... EF 60%, by 2-D echo    Patient Active Problem List   Diagnosis Date Noted  . Hypertension   . Nephrolithiasis   . CAD (coronary artery disease)   . Dyslipidemia   . History of tobacco use   . Syncope   . Ejection fraction   . Hx of CABG   . OBESITY, UNSPECIFIED 07/05/2010  . MICROSCOPIC HEMATURIA 07/05/2010      Current Outpatient Prescriptions  Medication Sig Dispense Refill  . aspirin 81 MG tablet Take 81 mg by mouth daily.      Marland Kitchen. lisinopril (PRINIVIL,ZESTRIL) 20 MG tablet Take 1 tablet (20 mg total) by mouth daily. 90 tablet 3  . atorvastatin (LIPITOR) 40 MG tablet Take 1 tablet (40 mg total) by mouth daily. 90 tablet 3   No current facility-administered medications for this visit.    Allergies:    Review of patient's allergies indicates no known allergies.    Social History:  The patient  reports that he quit smoking about 13 years ago. His smoking use included Cigarettes. He started smoking about 23 years ago. He has a 3 pack-year smoking history. He has never used smokeless tobacco. He reports that he drinks alcohol. He reports that he does not use illicit drugs.   Family History:  The patient's family history includes Stroke in his mother. There is no history of Heart disease or Coronary artery disease.    ROS:  Please see the history of present illness.   Patient denies fever, chills, headache, sweats, rash, change in vision, change in hearing, chest pain, cough, nausea or vomiting, urinary symptoms. All other systems are reviewed and are negative.     PHYSICAL EXAM: VS:  BP 155/84 mmHg  Pulse 62  Ht 5\' 4"  (1.626 m)  Wt 156 lb (70.761 kg)  BMI 26.76 kg/m2 , Patient is oriented to person time and place. Affect is normal. Head is atraumatic. Sclera and conjunctiva are normal. There is no jugular venous distention. Lungs are clear. Respiratory effort is nonlabored. Cardiac exam reveals S1 and S2. Abdomen is soft. There is no peripheral edema. There are no musculoskeletal deformities. There are no  skin rashes. Neurologic is grossly intact.  EKG:   EKG is done today and reviewed by me. There is increased voltage. There are diffuse nonspecific ST-T wave changes. There is no change from the past.   Recent Labs: No results found for requested labs within last 365 days.    Lipid Panel    Component Value Date/Time   CHOL * 07/23/2010 0440    214        ATP III CLASSIFICATION:  <200     mg/dL   Desirable  119-147  mg/dL   Borderline High  >=829    mg/dL   High          TRIG 562* 07/23/2010 0440   HDL 30* 07/23/2010 0440   CHOLHDL 7.1 07/23/2010 0440   VLDL 76* 07/23/2010 0440   LDLCALC * 07/23/2010 0440    108        Total Cholesterol/HDL:CHD Risk Coronary Heart Disease  Risk Table                     Men   Women  1/2 Average Risk   3.4   3.3  Average Risk       5.0   4.4  2 X Average Risk   9.6   7.1  3 X Average Risk  23.4   11.0        Use the calculated Patient Ratio above and the CHD Risk Table to determine the patient's CHD Risk.        ATP III CLASSIFICATION (LDL):  <100     mg/dL   Optimal  130-865  mg/dL   Near or Above                    Optimal  130-159  mg/dL   Borderline  784-696  mg/dL   High  >295     mg/dL   Very High      Wt Readings from Last 3 Encounters:  02/26/15 156 lb (70.761 kg)  10/21/13 163 lb (73.936 kg)  10/11/13 162 lb (73.483 kg)      Current medicines are reviewed  The patient has run out of his medications. I explained to him the importance of resuming his blood pressure medicine and his cholesterol medication.     ASSESSMENT AND PLAN:

## 2015-03-02 ENCOUNTER — Encounter (HOSPITAL_COMMUNITY): Payer: Self-pay

## 2015-03-02 ENCOUNTER — Encounter (HOSPITAL_COMMUNITY)
Admission: RE | Admit: 2015-03-02 | Discharge: 2015-03-02 | Disposition: A | Discharge status: Home / Self Care | Payer: No Typology Code available for payment source | Source: Ambulatory Visit | Attending: Cardiology | Admitting: Cardiology

## 2015-03-02 ENCOUNTER — Ambulatory Visit (HOSPITAL_COMMUNITY)
Admission: RE | Admit: 2015-03-02 | Discharge: 2015-03-02 | Disposition: A | Discharge status: Home / Self Care | Payer: No Typology Code available for payment source | Source: Ambulatory Visit | Attending: Cardiology | Admitting: Cardiology

## 2015-03-02 DIAGNOSIS — Z87891 Personal history of nicotine dependence: Secondary | ICD-10-CM | POA: Insufficient documentation

## 2015-03-02 DIAGNOSIS — R9431 Abnormal electrocardiogram [ECG] [EKG]: Secondary | ICD-10-CM | POA: Insufficient documentation

## 2015-03-02 DIAGNOSIS — I2510 Atherosclerotic heart disease of native coronary artery without angina pectoris: Secondary | ICD-10-CM | POA: Insufficient documentation

## 2015-03-02 LAB — NM MYOCAR MULTI W/SPECT W/WALL MOTION / EF
CHL CUP NUCLEAR SDS: 0
LVDIAVOL: 108 mL
LVSYSVOL: 57 mL
NUC STRESS TID: 1.06
RATE: 0.34
SRS: 6
SSS: 6

## 2015-03-02 LAB — MYOCARDIAL PERFUSION IMAGING
CHL CUP NUCLEAR SDS: 0
CHL CUP NUCLEAR SRS: 6
CHL CUP RESTING HR STRESS: 52 {beats}/min
CSEPED: 4 min
CSEPEDS: 36 s
CSEPHR: 96 %
Estimated workload: 7 METS
LV dias vol: 108 mL
LV sys vol: 57 mL
MPHR: 156 {beats}/min
NUC STRESS EF: 47 %
Peak HR: 150 {beats}/min
SSS: 6
TID: 1.06

## 2015-03-02 MED ORDER — TECHNETIUM TC 99M SESTAMIBI GENERIC - CARDIOLITE
30.0000 | Freq: Once | INTRAVENOUS | Status: AC | PRN
  Administered 2015-03-02: 30 via INTRAVENOUS

## 2015-03-02 MED ORDER — TECHNETIUM TC 99M SESTAMIBI - CARDIOLITE
10.0000 | Freq: Once | INTRAVENOUS | Status: AC | PRN
  Administered 2015-03-02: 10:00:00 10 via INTRAVENOUS

## 2015-03-05 ENCOUNTER — Encounter: Payer: Self-pay | Admitting: Cardiology

## 2015-03-05 ENCOUNTER — Telehealth: Payer: Self-pay | Admitting: *Deleted

## 2015-03-05 NOTE — Progress Notes (Signed)
I saw the patient in the office recently. We've arranged a stress nuclear study for his DOT evaluation. This study has been done. I have personally reviewed the data and compared it with his study from 2013. There has been no significant change. He has an old small area of inferolateral scar. There may be very mild peri-infarct ischemia. This is unchanged. He is clinically stable.  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  Jerral BonitoJeff Orlando Devereux, MD

## 2015-03-05 NOTE — Telephone Encounter (Signed)
Luis AbedJeffrey D Katz, MD   Sent: Mon Mar 05, 2015 2:14 PM    To: Eustace MooreLydia M Anderson, LPN        Message     Please notify the patient that his stress test has not changed since 2013. I am pleased with the result. 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

## 2015-03-05 NOTE — Telephone Encounter (Signed)
[facility]

## 2015-10-18 IMAGING — NM NM MYOCAR MULTI W/SPECT W/WALL MOTION & EF
2 series · 12 of 12 positions shown · non-contrast
Comparison: none

[Series 1: rest · 8.28mm/px · 6 of 64 frames shown]
[frame 6/64]
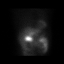
[frame 16/64]
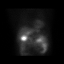
[frame 27/64]
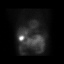
[frame 38/64]
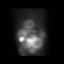
[frame 48/64]
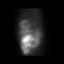
[frame 59/64]
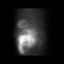

[Series 2: stress gated · 8.28mm/px · 6 of 64 frames shown]
[frame 6/64]
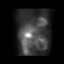
[frame 16/64]
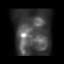
[frame 27/64]
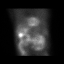
[frame 38/64]
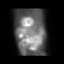
[frame 48/64]
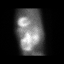
[frame 59/64]
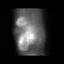

[12 of 12 positions shown; findings below may reference images not displayed]

This is a summary report. The complete report is available in the patient's medical record. If you cannot access the medical record, please contact the sending organization for a detailed fax or copy.

Patient Information
Name MRN Description
Zora Suave [id number] 63 year old Male

Study Information
Physician Technologist Supporting Staff
Ortilio Mahon, RN

Vitals
Height Weight BMI (Calculated)
5' 4" (1.626 m) 156 lb (70.761 kg)

Nuclear History and Indications
History and Indications
63 year old male with CAD s/p CABG, HTN, hyperlipidemia referred for ischemic testing as part of [facility] physical.

Nuclear Stress Measurements
LV Systolic Volume 57 mL

TID

LV Diastolic Volume 108 mL

Nuc Stress EF 47 %

SSS 6

SRS 6

SDS 0

Stress Measurements
Baseline Vitals
Rest HR 52 bpm

Rest BP 135/86 mmHg

Exercise Time
Exercise duration (min) 4 min

Exercise duration (sec) 36 sec

Peak Stress Vitals
Post peak HR 150 bpm

Post peak BP 175/99 mmHg

Exercise Data
MPHR 156 bpm

Percent HR 96 %

Estimated workload 7 METS

Nuclear Stress Findings
Isotope administration
The isotope used for nuclear imaging was FcXXm Sestamibi. IV Site: left antecubital fossa.Rest isotope was administered on 03/02/2015 at [DATE] with an IV injection of 10 mCi. Rest SPECT images were obtained approximately 45 minutes post tracer injection.
Stress isotope was administered on 03/02/2015 at [DATE] with an IV injection of 30 mCi at peak stress Stress SPECT images were obtained approximately 30 minutes post tracer injection.
Nuclear Study Quality
Diaphragmatic attenuation artifact was present.
Overall image quality is good.
Nuclear Measurements
Study was gated.
Rest Perfusion
There is a defect present in the basal inferolateral and mid inferolateral location.
Stress Perfusion
There is a defect present in the basal inferolateral and mid inferolateral location.
Perfusion Summary
Defect 1:

There is a medium defect of moderate severity present in the basal inferolateral and mid inferolateral location. The defect is partially reversible. Consistent with scar and mild peri-infarct ischemia.
Study Impression
Low to intermediate risk study (based on ejection fraction). There is evidence of inferolateral scar with mild peri-infarct ischemia. LVEF 47%. LV cavity size is normal.

From: [facility] DEL CARMEN/FELIX IVAN/YED/ADENYO/CAR RENTAL/[facility]IGER/MELBIN/[place] 4294 Appropriate Use Criteria for Coronary Revascularization Focused Update

Wall Scoring
Score Index: 2.000 Percent Normal: 0.0%

The left ventricle is globally hypokinetic.
Mild global hypokinesis.

Conclusion
Low to intermediate risk study (based on ejection fraction). Inferolateral scar with mild peri-infarct ischemia. LVEF 47% with mild global hypokinesis.

10
Tc Cardiolite Rest 10 mCi, Tc Cardiolite Stress 30 mCi, Treadmill, Cardiology to read

## 2016-05-06 ENCOUNTER — Encounter: Payer: Self-pay | Admitting: Cardiology

## 2016-05-06 ENCOUNTER — Ambulatory Visit (INDEPENDENT_AMBULATORY_CARE_PROVIDER_SITE_OTHER): Payer: Self-pay | Admitting: Cardiology

## 2016-05-06 VITALS — BP 165/88 | HR 58 | Ht 64.0 in | Wt 160.0 lb

## 2016-05-06 DIAGNOSIS — E785 Hyperlipidemia, unspecified: Secondary | ICD-10-CM

## 2016-05-06 DIAGNOSIS — I10 Essential (primary) hypertension: Secondary | ICD-10-CM

## 2016-05-06 DIAGNOSIS — R7309 Other abnormal glucose: Secondary | ICD-10-CM

## 2016-05-06 DIAGNOSIS — I2510 Atherosclerotic heart disease of native coronary artery without angina pectoris: Secondary | ICD-10-CM

## 2016-05-06 MED ORDER — ATORVASTATIN CALCIUM 80 MG PO TABS: 80.0000 mg | ORAL_TABLET | Freq: Every day | ORAL | Status: DC

## 2016-05-06 NOTE — Patient Instructions (Signed)
Your physician wants you to follow-up in: 1 YEAR WITH DR. BRANCH You will receive a reminder letter in the mail two months in advance. If you don't receive a letter, please call our office to schedule the follow-up appointment.  Your physician has recommended you make the following change in your medication:   INCREASE ATORVASTATIN 80 MG DAILY  Your physician recommends that you return for lab work PLEASE FAST PRIOR TO LAB WORK - WE HAVE GIVEN YOU LAB ORDERS  Your physician has requested that you regularly monitor and record your blood pressure readings at home FOR 1 WEEK AND CALL US WITH RESULTS. Please use the same machine at the same time of day to check your readings and record them to bring to your follow-up visit.  Thank you for choosing [facility]!!

## 2016-05-06 NOTE — Progress Notes (Signed)
Clinical Summary Roy White is a 65 y.o.male former patient of Dr Myrtis SerKatz, this is our first visit together.  1. CAD - history of CABG in 2011 (LIMA-LAD, SVG-OME2, SVG-PDA)  - nuclear stress 02/2015 inferolateral scar with mild peri-infarct ischemia, LVEF 47%  - denies any chest pain. No SOB or DOE.  - compliant with meds. No beta blocker I presume due to low HRs   2. HTN - compliant with meds, but has not taken yet today - does not check regularly at home.   3. 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  Past Medical History  Diagnosis Date  . Hypertension   . Nephrolithiasis   . CAD (coronary artery disease)   . Dyslipidemia   . History of tobacco use   . Syncope     December, 2011, very brief episode, probable dehydration and orthostatic changes  . Ejection fraction     EF. 60%, echo, September, 2011  . Hx of CABG     October, 2011     No Known Allergies   Current Outpatient Prescriptions  Medication Sig Dispense Refill  . aspirin 81 MG tablet Take 81 mg by mouth daily.      Roy White. atorvastatin (LIPITOR) 40 MG tablet Take 1 tablet (40 mg total) by mouth daily. 90 tablet 3  . lisinopril (PRINIVIL,ZESTRIL) 20 MG tablet Take 1 tablet (20 mg total) by mouth daily. 90 tablet 3   No current facility-administered medications for this visit.     Past Surgical History  Procedure Laterality Date  . Coronary artery bypass graft  07/2010    LIMA-LAD, SVG-OM2; SVG-PDA... EF 60%, by 2-D echo     No Known Allergies    Family History  Problem Relation Age of Onset  . Stroke Mother   . Heart disease Neg Hx   . Coronary artery disease Neg Hx      Social History Roy White reports that he quit smoking about 14 years ago. His smoking use included Cigarettes. He started smoking about 24 years ago. He has a 3 pack-year smoking history. He has never used smokeless tobacco. Roy White reports that he  drinks alcohol.   Review of Systems CONSTITUTIONAL: No weight loss, fever, chills, weakness or fatigue.  HEENT: Eyes: No visual loss, blurred vision, double vision or yellow sclerae.No hearing loss, sneezing, congestion, runny nose or sore throat.  SKIN: No rash or itching.  CARDIOVASCULAR: per HPI RESPIRATORY: No shortness of breath, cough or sputum.  GASTROINTESTINAL: No anorexia, nausea, vomiting or diarrhea. No abdominal pain or blood.  GENITOURINARY: No burning on urination, no polyuria NEUROLOGICAL: No headache, dizziness, syncope, paralysis, ataxia, numbness or tingling in the extremities. No change in bowel or bladder control.  MUSCULOSKELETAL: No muscle, back pain, joint pain or stiffness.  LYMPHATICS: No enlarged nodes. No history of splenectomy.  PSYCHIATRIC: No history of depression or anxiety.  ENDOCRINOLOGIC: No reports of sweating, cold or heat intolerance. No polyuria or polydipsia.  Roy White.   Physical Examination Filed Vitals:   05/06/16 1240  BP: 165/88  Pulse: 58   Filed Vitals:   05/06/16 1240  Height: 5\' 4"  (1.626 m)  Weight: 160 lb (72.576 kg)    Gen: resting comfortably, no acute distress HEENT: no scleral icterus, pupils equal round and reactive, no palptable cervical adenopathy,  CV: RRR, no m/r/g, noj vd Resp: Clear to auscultation bilaterally GI: abdomen is soft, non-tender, non-distended,  normal bowel sounds, no hepatosplenomegaly MSK: extremities are warm, no edema.  Skin: warm, no rash Neuro:  no focal deficits Psych: appropriate affect   Diagnostic Studies 02/2015 Nuclear stress test  Study Impression Low to intermediate risk study (based on ejection fraction). There is evidence of inferolateral scar with mild peri-infarct ischemia. LVEF 47%. LV cavity size is normal.          Assessment and Plan   1. CAD - no current symptoms. EKG in clinic SR, LVH and chronic ST/T changes - continue current meds  2. HTN - elevated in clinic,  however has not taken meds yet - he will submit bp log in 1 week  3. Hyperlipidemia - repeat lipid panel - in setting of CAD change to high dose statin, atorvastatin  daily    F/u 1 year. Order annual labs. Given list of local pcp's     Antoine Poche, M.D.

## 2017-05-20 ENCOUNTER — Telehealth: Payer: Self-pay | Admitting: Cardiology

## 2017-05-20 NOTE — Telephone Encounter (Signed)
Numerous attempts to contact patient with recall letters. Unable to reach by telephone. with no success.    05/06/2016 2:08 PM New [10]    [System] 07/27/2016 11:01 PM Notification Sent [20]   Roy White, Roy White [[id number]][[id number]] 04/09/2017 12:20 PM Notification Sent [20]   Roy White, Roy White [[id number]][[id number]] 04/09/2017 12:21 PM Notification Sent [20]   Roy White, Roy White [[id number]][[id number]] 05/20/2017 8:32 AM Notification Sent [20]

## 2021-03-14 ENCOUNTER — Encounter (INDEPENDENT_AMBULATORY_CARE_PROVIDER_SITE_OTHER): Payer: Self-pay | Admitting: *Deleted

## 2023-08-18 NOTE — Progress Notes (Deleted)
NEUROLOGY CONSULTATION NOTE  Roy White MRN: [id number] DOB: 1951/03/01  Referring provider: Elenora Fender, DO Primary care provider: ***  Reason for consult:  Stroke  Assessment/Plan:   ***   Subjective:  Roy White is a 72 year old ***-handed male with HTN, CAD s/p CABG and dyslipidemia who presents for stroke.  History supplemented by hospital notes.  On 10/14, he developed slurred speech and was repeating words over and over again.  He was unable to answer specific questions.  No facial droop or unilateral numbness or weakness.  Brought to [facility].  Concern for stroke or seizure.  Loaded with Keppra.  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  2D echo showed LVEF 40-45% with no significant valvular disease and negative Bubble study.  He was found to be bradycardic in the 50s.  Reportedly, his PCP had started him on new medications for elevated blood pressure as high as 200 systolic.  Coreg was held.  He was started on ASA 81mg  and Plavix 75mg  for 21 days followed by ASA 81mg  alone.  Crestor was increased to 20mg .  A Zio patch was placed at discharge.    08/11/2023 MRI BRAIN:  1. Scattered small acute to subacute infarcts in both cerebral and cerebellar hemispheres of varying ages, possibly embolic in etiology given involvement of multiple vascular territories. 2. Additional remote infarcts involving left caudate body, corona radiata, and centrum semiovale, right thalamus and corona radiata, brainstem, and left cerebellar hemisphere as well as background chronic small-vessel ischemic change   08/10/2023 CTA HEAD & NECK:  No emergent large vessel occlusion. 2. 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 3. Severe bilateral intradural and vertebral artery origin stenosis. 4. 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      PAST MEDICAL HISTORY: Past Medical History:  Diagnosis Date   CAD (coronary artery disease)    Dyslipidemia    Ejection fraction    EF. 60%, echo, September, 2011   History of tobacco use    Hx of CABG    October, 2011   Hypertension    Nephrolithiasis    Syncope    December, 2011, very brief episode, probable dehydration and orthostatic changes    PAST SURGICAL HISTORY: Past Surgical History:  Procedure Laterality Date   CORONARY ARTERY BYPASS GRAFT  07/2010   LIMA-LAD, SVG-OM2; SVG-PDA... EF 60%, by 2-D echo    MEDICATIONS: Current Outpatient Medications on File Prior to Visit  Medication Sig Dispense Refill   aspirin 81 MG tablet Take 81 mg by mouth daily.       atorvastatin (LIPITOR) 80 MG tablet Take 1 tablet (80 mg total) by mouth daily. 90 tablet 3   lisinopril (PRINIVIL,ZESTRIL) 20 MG tablet Take 1 tablet (20 mg total) by mouth daily. 90 tablet 3   No current facility-administered medications on file prior to visit.    ALLERGIES: No Known Allergies  FAMILY HISTORY: Family History  Problem Relation Age of Onset   Stroke Mother    Heart disease Neg Hx    Coronary artery disease Neg Hx     Objective:  *** General: No acute  distress.  Patient appears well-groomed.   Head:  Normocephalic/atraumatic Eyes:  fundi examined but not visualized Neck: supple, no paraspinal tenderness, full range of motion Back: No paraspinal tenderness Heart: regular rate and rhythm Lungs: Clear to auscultation bilaterally. Vascular: No carotid bruits. Neurological Exam: Mental status: alert and oriented to person, place, and time, speech fluent and not dysarthric,  language intact. Cranial nerves: CN I: not tested CN II: pupils equal, round and reactive to light, visual fields intact CN III, IV, VI:  full range of motion, no nystagmus, no ptosis CN V: facial sensation intact. CN VII: upper and lower face symmetric CN VIII: hearing intact CN IX, X: gag intact, uvula midline CN XI: sternocleidomastoid and trapezius muscles intact CN XII: tongue midline Bulk & Tone: normal, no fasciculations. Motor:  muscle strength 5/5 throughout Sensation:  Pinprick, temperature and vibratory sensation intact. Deep Tendon Reflexes:  2+ throughout,  toes downgoing.   Finger to nose testing:  Without dysmetria.   Heel to shin:  Without dysmetria.   Gait:  Normal station and stride.  Romberg negative.    Thank you for allowing me to take part in the care of this patient.  Shon Millet, DO  CC: ***

## 2023-08-19 ENCOUNTER — Ambulatory Visit: Payer: Self-pay | Admitting: Neurology

## 2023-08-19 DIAGNOSIS — Z029 Encounter for administrative examinations, unspecified: Secondary | ICD-10-CM

## 2023-08-20 ENCOUNTER — Encounter: Payer: Self-pay | Admitting: Neurology

## 2023-08-25 ENCOUNTER — Encounter: Payer: Self-pay | Admitting: Cardiology

## 2023-08-25 ENCOUNTER — Ambulatory Visit: Payer: Self-pay | Attending: Cardiology | Admitting: Cardiology

## 2023-08-25 VITALS — BP 132/80 | HR 65 | Ht 64.0 in | Wt 155.0 lb

## 2023-08-25 DIAGNOSIS — Z79899 Other long term (current) drug therapy: Secondary | ICD-10-CM

## 2023-08-25 DIAGNOSIS — I5022 Chronic systolic (congestive) heart failure: Secondary | ICD-10-CM

## 2023-08-25 DIAGNOSIS — E782 Mixed hyperlipidemia: Secondary | ICD-10-CM

## 2023-08-25 DIAGNOSIS — I10 Essential (primary) hypertension: Secondary | ICD-10-CM

## 2023-08-25 MED ORDER — ENTRESTO 24-26 MG PO TABS: 1.0000 | ORAL_TABLET | Freq: Two times a day (BID) | ORAL | 0 refills | Status: DC

## 2023-08-25 MED ORDER — ENTRESTO 24-26 MG PO TABS: 1.0000 | ORAL_TABLET | Freq: Two times a day (BID) | ORAL | 6 refills | Status: DC

## 2023-08-25 NOTE — Patient Instructions (Addendum)
Medication Instructions:   Stop Norvasc (Amlodipine) Stop Lisinopril  Begin Entresto 24/26 twice a day - may begin this on Friday Continue all other medications.     Labwork:  BMET - order given  Please do in 2 weeks Office will contact with results via phone, letter or mychart.     Testing/Procedures:  none  Follow-Up:  1 month    Any Other Special Instructions Will Be Listed Below (If Applicable).   If you need a refill on your cardiac medications before your next appointment, please call your pharmacy.

## 2023-08-25 NOTE — Progress Notes (Signed)
Clinical Summary Mr. Alberico is a 72 y.o.male seen as a new patient, last seen in 04/2016 in our office  Former long time patient of Dr Myrtis Ser, I saw him one in 05/06/2016  1. CAD - history of CABG in 2011 (LIMA-LAD, SVG-OME2, SVG-PDA) - 2011 echo LVEF 60% - nuclear stress 02/2015 inferolateral scar with mild peri-infarct ischemia, LVEF 47%    - 07/2023 echo: LVEF 40-45%, grade II dd, mild AI - no SOB/DOE. No recent edema. No chest pains - does heavy house work without symptoms.     2. HTN - compliant with mes   3. Hyperlipidemia - 07/2023 TC 137 TG 175 HDL 36 LDL 66 - based these labs and CVA admit atorva increased from 40 to 80mg    4. CVA - admit [facility] 07/2023 - MRI brain completed this morning shows acute to subacute infarcts in both cerebral and cerebellar hemispheres of varying ages as well as remote infarcts and background chronic small vessel ischemic changes.  - Discharge plan aspirin and Plavix dual therapy for the next 21 days followed by sole therapy with aspirin  -has 2 week zio patch on now - 07/2023 echo: LVEF 40-45%, grade II dd, mild AI      SH: retired a few months ago, previous truck Hospital doctor.  Works part time for [employer] Past Medical History:  Diagnosis Date   CAD (coronary artery disease)    Dyslipidemia    Ejection fraction    EF. 60%, echo, September, 2011   History of tobacco use    Hx of CABG    October, 2011   Hypertension    Nephrolithiasis    Syncope    December, 2011, very brief episode, probable dehydration and orthostatic changes     No Known Allergies   Current Outpatient Medications  Medication Sig Dispense Refill   aspirin 81 MG tablet Take 81 mg by mouth daily.       atorvastatin (LIPITOR) 80 MG tablet Take 1 tablet (80 mg total) by mouth daily. 90 tablet 3   lisinopril (PRINIVIL,ZESTRIL) 20 MG tablet Take 1 tablet (20 mg total) by mouth daily. 90 tablet 3   No current facility-administered medications  for this visit.     Past Surgical History:  Procedure Laterality Date   CORONARY ARTERY BYPASS GRAFT  07/2010   LIMA-LAD, SVG-OM2; SVG-PDA... EF 60%, by 2-D echo     No Known Allergies    Family History  Problem Relation Age of Onset   Stroke Mother    Heart disease Neg Hx    Coronary artery disease Neg Hx      Social History Mr. Swinea reports that he quit smoking about 21 years ago. His smoking use included cigarettes. He started smoking about 31 years ago. He has a 3 pack-year smoking history. He has never used smokeless tobacco. Mr. Buchholtz reports current alcohol use.   Review of Systems CONSTITUTIONAL: No weight loss, fever, chills, weakness or fatigue.  HEENT: Eyes: No visual loss, blurred vision, double vision or yellow sclerae.No hearing loss, sneezing, congestion, runny nose or sore throat.  SKIN: No rash or itching.  CARDIOVASCULAR: per hpi RESPIRATORY: No shortness of breath, cough or sputum.  GASTROINTESTINAL: No anorexia, nausea, vomiting or diarrhea. No abdominal pain or blood.  GENITOURINARY: No burning on urination, no polyuria NEUROLOGICAL: No headache, dizziness, syncope, paralysis, ataxia, numbness or tingling in the extremities. No change in bowel or bladder control.  MUSCULOSKELETAL: No muscle, back pain, joint  pain or stiffness.  LYMPHATICS: No enlarged nodes. No history of splenectomy.  PSYCHIATRIC: No history of depression or anxiety.  ENDOCRINOLOGIC: No reports of sweating, cold or heat intolerance. No polyuria or polydipsia.  Marland Kitchen   Physical Examination Today's Vitals   08/25/23 0854  BP: 132/80  Pulse: 65  SpO2: 98%  Weight: 155 lb (70.3 kg)  Height: 5\' 4"  (1.626 m)   Body mass index is 26.61 kg/m.  Gen: resting comfortably, no acute distress HEENT: no scleral icterus, pupils equal round and reactive, no palptable cervical adenopathy,  CV Resp: Clear to auscultation bilaterally GI: abdomen is soft, non-tender, non-distended,  normal bowel sounds, no hepatosplenomegaly MSK: extremities are warm, no edema.  Skin: warm, no rash Neuro:  no focal deficits Psych: appropriate affect   Diagnostic Studies  07/2023 echo [facility]  1. The left ventricle is normal in size with normal wall thickness.    2. The left ventricular systolic function is normal, LVEF is visually  estimated at 40-45%.    3. There is grade II diastolic dysfunction (elevated filling pressure).    4. The mitral valve leaflets are mildly thickened with normal leaflet  mobility.   5. The aortic valve is trileaflet with mildly thickened leaflets with normal  excursion.   6. There is mild aortic regurgitation.    7. The left atrium is mildly to moderately dilated in size.    8. The right ventricle is normal in size, with normal systolic function.    9. There is no evidence of an interatrial flow communication or  intrapulmonary shunt by agitated saline study.    Assessment and Plan   1. CAD - no symptoms - EKG shows SR, chronic ST/T changes - continue current meds  2. Chronic HFrEF - unknown duration - echo [facility] 07/2023 LVEF 40-45% - prior to that last echo 2011 wad 60%.  - nuclear can 2016 LVEF 47% but unclear accuracy of LVEF from a nuclear scan  - no symptoms - start toprol 12.5mg  daily - stop lisinopril, wait 48 hrs start entresto 24/26mg  bid. BMET 2 weeks - titrate meds over follow up, likely won't tolerate high beta blocker dosing due to heart rates. Add MRA and SGLT2i over time - repeat echo over 3-6 months. With mild LV dysfunction of unknown chronicity, no symptoms, recent CVA would not pursue ischemic testing at this time. If persistent dysfunction on repeat echo consider at that time   2. HTN - at goal. Med changes due to HFrEF as outalined above    3. Hyperlipidemia - LDL goal <55. Recent increase in his atorvastatin to 80mg  daily, follow LDL over time  4. CVA - missed his f/u with neuro, given phone number to reschedule -  has zio patch on through [facility], will need to f/u with results at our next f/u   F/u 1 month     Antoine Poche, M.D.

## 2023-08-26 ENCOUNTER — Telehealth: Payer: Self-pay | Admitting: Cardiology

## 2023-08-26 MED ORDER — LISINOPRIL 20 MG PO TABS: 20.0000 mg | ORAL_TABLET | Freq: Every day | ORAL | 3 refills | Status: DC

## 2023-08-26 NOTE — Telephone Encounter (Signed)
Advised patient of this sent in new Rx for Lisinopril 20 Mg until PAF are filled out for entresto will place forms for patients daughter to pick up to fill out and return to [place]

## 2023-08-26 NOTE — Telephone Encounter (Signed)
Can work on additional insurance forms and also please provide info for patient assistance. Can restart his lisinopril at prior dose, please remind them if able to get entresto then has to be off the lisinopril for 2 days prior to starting the Tonye Pearson MD

## 2023-08-26 NOTE — Telephone Encounter (Signed)
Pt c/o medication issue:  1. Name of Medication:   sacubitril-valsartan (ENTRESTO) 24-26 MG    2. How are you currently taking this medication (dosage and times per day)? Not currently taking  3. Are you having a reaction (difficulty breathing--STAT)? No   4. What is your medication issue? Patient states the prescription was going to cost over $700 to pick up which he is unable to afford. Please advise.

## 2023-08-26 NOTE — Telephone Encounter (Signed)
Patient only has medicare part A he is going to apply for medicare part B and daughter is filling for additional insurance for him she states he cannot afford this medication on his income. Patient was just prescribed this medication and has not started it yet due to the cost patient had no issue paying for Lisinopril in the past and is wiling to go back on this.   I advised her the options we have is Patient assistance forms to fill out and she asked if Dr.Branch would consider restarting the Lisinopril instead.

## 2023-09-01 ENCOUNTER — Other Ambulatory Visit: Payer: Self-pay | Admitting: *Deleted

## 2023-09-01 MED ORDER — ENTRESTO 24-26 MG PO TABS: 1.0000 | ORAL_TABLET | Freq: Two times a day (BID) | ORAL | 3 refills | Status: DC

## 2023-09-03 ENCOUNTER — Telehealth: Payer: Self-pay | Admitting: *Deleted

## 2023-09-03 NOTE — Telephone Encounter (Signed)
Received fax from [employer] - patient approved to receive Entresto until 10/26/2024 at no cost to them.

## 2023-10-01 ENCOUNTER — Encounter: Payer: Self-pay | Admitting: Nurse Practitioner

## 2023-10-01 ENCOUNTER — Ambulatory Visit: Payer: Self-pay | Attending: Nurse Practitioner | Admitting: Nurse Practitioner

## 2023-10-01 VITALS — BP 141/72 | HR 50 | Ht 64.0 in | Wt 153.2 lb

## 2023-10-01 DIAGNOSIS — I10 Essential (primary) hypertension: Secondary | ICD-10-CM

## 2023-10-01 DIAGNOSIS — Z8673 Personal history of transient ischemic attack (TIA), and cerebral infarction without residual deficits: Secondary | ICD-10-CM

## 2023-10-01 DIAGNOSIS — R001 Bradycardia, unspecified: Secondary | ICD-10-CM

## 2023-10-01 DIAGNOSIS — I471 Supraventricular tachycardia, unspecified: Secondary | ICD-10-CM

## 2023-10-01 DIAGNOSIS — I5022 Chronic systolic (congestive) heart failure: Secondary | ICD-10-CM

## 2023-10-01 DIAGNOSIS — E785 Hyperlipidemia, unspecified: Secondary | ICD-10-CM

## 2023-10-01 DIAGNOSIS — I2510 Atherosclerotic heart disease of native coronary artery without angina pectoris: Secondary | ICD-10-CM

## 2023-10-01 NOTE — Patient Instructions (Signed)
Medication Instructions:   I removed Entresto from your medication list. Remain on all other medications  Labwork: Get your BMET next week  Testing/Procedures: None today  Follow-Up: 6-8 weeks  Any Other Special Instructions Will Be Listed Below (If Applicable).  If you need a refill on your cardiac medications before your next appointment, please call your pharmacy.

## 2023-10-01 NOTE — Progress Notes (Signed)
Cardiology Office Note:  .   Date: 10/01/2023 ID:  Roy White, DOB 1951/10/18, MRN [id number] PCP: Lennox Pippins, NP  [facility] Providers Cardiologist:  Dina Rich, MD    History of Present Illness: .   Roy White is a 72 y.o. male with a PMH of CAD, s/p CABG in 2011 (SVG-OMB 2, LIMA-LAD, SVG-PDA), HFmrEF, hypertension, hyperlipidemia, history of CVA (07/2023), who presents today for 1 month follow-up.  Nuclear stress test in 2016 showed inferolateral scar with mild peri-infarct ischemia, LVEF 47%.  Echocardiogram in October 24 showed EF 40 to 45%, grade 2 DD, mild AI.  Hospitalized in October 24 at [facility] for CVA.  MRI of brain revealed acute to subacute infarcts in both cerebral and cerebellar hemispheres varying ages, remote infarcts and background chronic small vessel ischemic changes also noted.  Last seen by Dr. Dina Rich on August 25, 2023 shortly after hospital discharge.  He was wearing a 2-week ZIO monitor at the time.  Was started on Toprol 12.5 mg daily and was instructed to stop lisinopril and wait 48 hours and then begin low-dose Entresto.  Dr. Wyline Mood recommended titrating medications over follow-up and suggested the patient would not be able to tolerate higher dose beta-blocker dosing due to heart rates, recommended to add MRA/SGLT2 inhibitor over time and to repeat echocardiogram in 3 to 6 months.  If persistent dysfunction on repeat echo, could consider pursuing ischemic evaluation.  It was noted the patient had missed his follow-up with neurology, was instructed to follow-up with them.  Monitor from [facility] showed predominant sinus rhythm,   Today presents for 1 month follow-up.  He states he is doing well.  Denies any acute cardiac complaints or issues.  And Entresto and continues to take lisinopril due to the cost of Entresto. Denies any chest pain, shortness of breath, palpitations, syncope, presyncope, dizziness, orthopnea, PND,  swelling or significant weight changes, acute bleeding, or claudication.  Weight is stable.  Compliant with his medications.  ROS: Negative.  See HPI.  Studies Reviewed: .    ZIO XT monitor [facility] 08/2023: Conclusions:  - Ambulatory ECG monitoring was performed from 08/11/23 to 08/25/23, with  ~14 days of analyzable data.  -  The predominant rhythm was sinus rhythm, with sinus rate ranging from  42 to 139 and averaging 67 bpm.  -  Occasional PACs were recorded with  1.1% burden.  -  44 episodes of asymptomatic SVT occurred, the run with the fastest  interval lasting 7 beats with a max rate of 176 bpm, the longest lasting  16.6 secs with an avg rate of 97 bpm.  -  Occasional PVCs were recorded with  1.1% burden.  -  No episodes of wide complex tachycardia occurred.  -  There were no diary entries or patient triggered events.  -  No pauses >3 seconds or high-grade AV block detected.  -  No atrial fibrillation detected.   07/2023 Echo at [facility]: 1. The left ventricle is normal in size with normal wall thickness.    2. The left ventricular systolic function is normal, LVEF is visually  estimated at 40-45%.    3. There is grade II diastolic dysfunction (elevated filling pressure).    4. The mitral valve leaflets are mildly thickened with normal leaflet  mobility.   5. The aortic valve is trileaflet with mildly thickened leaflets with normal  excursion.   6. There is mild aortic regurgitation.    7. The left  atrium is mildly to moderately dilated in size.    8. The right ventricle is normal in size, with normal systolic function.    9. There is no evidence of an interatrial flow communication or  intrapulmonary shunt by agitated saline study.    Lexiscan 02/2015: Conclusion  Low to intermediate risk study (based on ejection fraction). Inferolateral scar with mild peri-infarct ischemia. LVEF 47% with mild global hypokinesis.   Risk Assessment/Calculations:    HYPERTENSION  CONTROL Vitals:   10/01/23 0902 10/01/23 0925  BP: (!) 146/82 (!) 141/72    The patient's blood pressure is elevated above target today.  In order to address the patient's elevated BP: Blood pressure will be monitored at home to determine if medication changes need to be made.; Follow up with general cardiology has been recommended.    Physical Exam:   VS:  BP (!) 141/72 (BP Location: Left Arm, Patient Position: Sitting, Cuff Size: Normal)   Pulse (!) 50   Ht 5\' 4"  (1.626 m)   Wt 153 lb 3.2 oz (69.5 kg)   SpO2 98%   BMI 26.30 kg/m    Wt Readings from Last 3 Encounters:  10/01/23 153 lb 3.2 oz (69.5 kg)  08/25/23 155 lb (70.3 kg)  05/06/16 160 lb (72.6 kg)    GEN: Well nourished, well developed in no acute distress NECK: No JVD; No carotid bruits CARDIAC: S1/S2, slow rate and regular rhythm, no murmurs, rubs, gallops RESPIRATORY:  Clear to auscultation without rales, wheezing or rhonchi  ABDOMEN: Soft, non-tender, non-distended EXTREMITIES:  No edema; No deformity   ASSESSMENT AND PLAN: .    HFmrEF Stage C, NYHA class I symptoms.  EF from October 2024 Echo at [facility] was mildly reduced at 40 to 45%, grade 2 DD. Euvolemic and well compensated on exam.  Has not started Entresto due to cost.  Discussed medication options with patient, and came to shared medical decision with patient that we will continue current medication regimen and switch lisinopril to Entresto at next office visit and will work on assistance with affording medication at that office visit.  Will obtain BMET as previously ordered. Low sodium diet, fluid restriction <2L, and daily weights encouraged. Educated to contact our office for weight gain of 2 lbs overnight or 5 lbs in one week.  CAD, s/p CABG in 2011 Stable with no anginal symptoms. No indication for ischemic evaluation.  Continue current medication regimen. Heart healthy diet and regular cardiovascular exercise encouraged.   3.  Hyperlipidemia Most recent  LDL from October 2024 was 66.  LDL is not quite at goal.  By next office visit, plan to address adding Zetia to medication regimen. Heart healthy diet and regular cardiovascular exercise encouraged.    4.  Hypertension, bradycardia, SVT BP mildly elevated in office today, says he just took his medication not too long ago.  Heart rate is in low 50s today on exam, he is asymptomatic.  Monitor report noted above-had some asymptomatic SVT episodes, longest episode was brief in duration.  No A-fib, pauses, high degree heart blocks, or significant arrhythmias noted on report.  No medication changes at this time.  Will continue to monitor.  If patient becomes symptomatic with bradycardia, plan to reduce carvedilol dosage.  Heart healthy diet and regular cardiovascular exercise encouraged.   5.  History of CVA Denies any symptoms.  Continue current medication regimen.  Recent monitor did not reveal any A-fib.  Follow-up with PCP and Neurology.    Dispo: Follow-up  with me/APP in 6 to 8 weeks or sooner if anything changes.  Signed, Sharlene Dory, NP

## 2023-11-12 ENCOUNTER — Ambulatory Visit: Payer: Self-pay | Admitting: Nurse Practitioner

## 2023-12-21 ENCOUNTER — Ambulatory Visit: Payer: Self-pay | Attending: Nurse Practitioner | Admitting: Nurse Practitioner

## 2023-12-21 ENCOUNTER — Encounter: Payer: Self-pay | Admitting: Nurse Practitioner

## 2023-12-21 VITALS — BP 116/68 | HR 67 | Ht 64.0 in | Wt 149.0 lb

## 2023-12-21 DIAGNOSIS — I10 Essential (primary) hypertension: Secondary | ICD-10-CM

## 2023-12-21 DIAGNOSIS — I5022 Chronic systolic (congestive) heart failure: Secondary | ICD-10-CM

## 2023-12-21 DIAGNOSIS — I471 Supraventricular tachycardia, unspecified: Secondary | ICD-10-CM

## 2023-12-21 DIAGNOSIS — R001 Bradycardia, unspecified: Secondary | ICD-10-CM

## 2023-12-21 DIAGNOSIS — I2510 Atherosclerotic heart disease of native coronary artery without angina pectoris: Secondary | ICD-10-CM

## 2023-12-21 DIAGNOSIS — Z8673 Personal history of transient ischemic attack (TIA), and cerebral infarction without residual deficits: Secondary | ICD-10-CM

## 2023-12-21 DIAGNOSIS — E785 Hyperlipidemia, unspecified: Secondary | ICD-10-CM

## 2023-12-21 MED ORDER — ROSUVASTATIN CALCIUM 20 MG PO TABS: 20.0000 mg | ORAL_TABLET | Freq: Every day | ORAL | 1 refills | Status: DC

## 2023-12-21 MED ORDER — CARVEDILOL 25 MG PO TABS: 25.0000 mg | ORAL_TABLET | Freq: Two times a day (BID) | ORAL | 1 refills | Status: AC

## 2023-12-21 NOTE — Patient Instructions (Addendum)
 Medication Instructions:  Your physician has recommended you make the following change in your medication:  Please restart Coreg 3.125 Twice daily  Please restart Crestor 20 Mg daily   Labwork: None   Testing/Procedures: None   Follow-Up: Your physician recommends that you schedule a follow-up appointment in: 4-6 weeks   Any Other Special Instructions Will Be Listed Below (If Applicable).  If you need a refill on your cardiac medications before your next appointment, please call your pharmacy.

## 2023-12-21 NOTE — Progress Notes (Unsigned)
 Cardiology Office Note:  .   Date: 12/21/2023 ID:  Roy White, DOB May 13, 1951, MRN [id number] PCP: Lennox Pippins, NP  [facility] Providers Cardiologist:  Dina Rich, MD    History of Present Illness: .   Roy White is a 73 y.o. male with a PMH of CAD, s/p CABG in 2011 (SVG-OMB 2, LIMA-LAD, SVG-PDA), HFmrEF, hypertension, hyperlipidemia, history of CVA (07/2023), who presents today for follow-up.  Nuclear stress test in 2016 showed inferolateral scar with mild peri-infarct ischemia, LVEF 47%.  Echocardiogram in October 24 showed EF 40 to 45%, grade 2 DD, mild AI.  Hospitalized in October 24 at [facility] for CVA.  MRI of brain revealed acute to subacute infarcts in both cerebral and cerebellar hemispheres varying ages, remote infarcts and background chronic small vessel ischemic changes also noted.  Last seen by Dr. Dina Rich on August 25, 2023 shortly after hospital discharge.  He was wearing a 2-week ZIO monitor at the time.  Was started on Toprol 12.5 mg daily and was instructed to stop lisinopril and wait 48 hours and then begin low-dose Entresto.  Dr. Wyline Mood recommended titrating medications over follow-up and suggested the patient would not be able to tolerate higher dose beta-blocker dosing due to heart rates, recommended to add MRA/SGLT2 inhibitor over time and to repeat echocardiogram in 3 to 6 months.  If persistent dysfunction on repeat echo, could consider pursuing ischemic evaluation.  It was noted the patient had missed his follow-up with neurology, was instructed to follow-up with them.  Monitor from [facility] showed predominant sinus rhythm,   10/01/2023 - Today presents for 1 month follow-up.  He states he is doing well.  Denies any acute cardiac complaints or issues.  And Entresto and continues to take lisinopril due to the cost of Entresto. Denies any chest pain, shortness of breath, palpitations, syncope, presyncope, dizziness, orthopnea, PND,  swelling or significant weight changes, acute bleeding, or claudication.  Weight is stable.  Compliant with his medications.  12/21/2023 -he presents today for follow-up.  Stopped carvedilol and rosuvastatin due to running out of these medications and not refilling.  Overall doing well from a cardiac perspective denies any acute cardiac complaints or issues. Denies any chest pain, shortness of breath, palpitations, syncope, presyncope, dizziness, orthopnea, PND, swelling or significant weight changes, acute bleeding, or claudication.  ROS: Negative.  See HPI.  Studies Reviewed: Marland Kitchen    EKG: EKG is not ordered today.   ZIO XT monitor [facility] 08/2023: Conclusions:  - Ambulatory ECG monitoring was performed from 08/11/23 to 08/25/23, with  ~14 days of analyzable data.  -  The predominant rhythm was sinus rhythm, with sinus rate ranging from  42 to 139 and averaging 67 bpm.  -  Occasional PACs were recorded with  1.1% burden.  -  44 episodes of asymptomatic SVT occurred, the run with the fastest  interval lasting 7 beats with a max rate of 176 bpm, the longest lasting  16.6 secs with an avg rate of 97 bpm.  -  Occasional PVCs were recorded with  1.1% burden.  -  No episodes of wide complex tachycardia occurred.  -  There were no diary entries or patient triggered events.  -  No pauses >3 seconds or high-grade AV block detected.  -  No atrial fibrillation detected.   07/2023 Echo at [facility]: 1. The left ventricle is normal in size with normal wall thickness.    2. The left ventricular systolic function is  normal, LVEF is visually  estimated at 40-45%.    3. There is grade II diastolic dysfunction (elevated filling pressure).    4. The mitral valve leaflets are mildly thickened with normal leaflet  mobility.   5. The aortic valve is trileaflet with mildly thickened leaflets with normal  excursion.   6. There is mild aortic regurgitation.    7. The left atrium is mildly to moderately dilated in  size.    8. The right ventricle is normal in size, with normal systolic function.    9. There is no evidence of an interatrial flow communication or  intrapulmonary shunt by agitated saline study.    Lexiscan 02/2015: Conclusion  Low to intermediate risk study (based on ejection fraction). Inferolateral scar with mild peri-infarct ischemia. LVEF 47% with mild global hypokinesis.    Physical Exam:   VS:  BP 116/68   Pulse 67   Ht 5\' 4"  (1.626 m)   Wt 149 lb (67.6 kg)   SpO2 98%   BMI 25.58 kg/m    Wt Readings from Last 3 Encounters:  12/21/23 149 lb (67.6 kg)  10/01/23 153 lb 3.2 oz (69.5 kg)  08/25/23 155 lb (70.3 kg)    GEN: Well nourished, well developed in no acute distress NECK: No JVD; No carotid bruits CARDIAC: S1/S2, RRR, no murmurs, rubs, gallops RESPIRATORY:  Clear to auscultation without rales, wheezing or rhonchi  ABDOMEN: Soft, non-tender, non-distended EXTREMITIES:  No edema; No deformity   ASSESSMENT AND PLAN: .    HFmrEF Stage C, NYHA class I symptoms.  EF from October 2024 Echo at [facility] was mildly reduced at 40 to 45%, grade 2 DD. Euvolemic and well compensated on exam.  Has some affordability issues with Entresto so we will continue lisinopril.  Stopped carvedilol due to running out of medication and not refilling.  Will restart carvedilol 3.125 mg twice daily today.  GDMT limited d/t current BP. Low sodium diet, fluid restriction <2L, and daily weights encouraged. Educated to contact our office for weight gain of 2 lbs overnight or 5 lbs in one week.  CAD, s/p CABG in 2011 Stable with no anginal symptoms. No indication for ischemic evaluation.  Will restart carvedilol 3.125 mg twice daily and rosuvastatin.  No other medication changes at this time and plan to obtain FLP/LFT at next office visit.  Heart healthy diet and regular cardiovascular exercise encouraged. Care and ED precautions discussed.   3.  Hyperlipidemia Most recent LDL from October 2024 was 66.   Since I last saw him he stopped rosuvastatin due to running out of medication and not refilling.  Will restart this for him today.  Plan to obtain FLP/LFT at next office visit.  Heart healthy diet and regular cardiovascular exercise encouraged.    4.  Hypertension, bradycardia, SVT BP stable. Discussed to monitor BP at home at least 2 hours after medications and sitting for 5-10 minutes. Monitor report noted above-had some asymptomatic SVT episodes, longest episode was brief in duration.  No A-fib, pauses, high degree heart blocks, or significant arrhythmias noted on report.  Starting low-dose carvedilol as mentioned above.  No other medication changes at this time besides what is mentioned above. Heart healthy diet and regular cardiovascular exercise encouraged.   5.  History of CVA Denies any symptoms.  Restarting rosuvastatin as mentioned above.  Continue rest of medication regimen.  Past monitor did not reveal any A-fib.  Follow-up with PCP and Neurology.    Dispo: Follow-up with me/APP  in 6 weeks or sooner if anything changes.  Signed, Sharlene Dory, NP

## 2024-02-02 ENCOUNTER — Encounter: Payer: Self-pay | Admitting: Nurse Practitioner

## 2024-02-02 ENCOUNTER — Ambulatory Visit: Payer: Self-pay | Attending: Nurse Practitioner | Admitting: Nurse Practitioner

## 2024-02-02 VITALS — BP 132/80 | HR 60 | Ht 64.0 in | Wt 147.8 lb

## 2024-02-02 DIAGNOSIS — R001 Bradycardia, unspecified: Secondary | ICD-10-CM

## 2024-02-02 DIAGNOSIS — I5022 Chronic systolic (congestive) heart failure: Secondary | ICD-10-CM

## 2024-02-02 DIAGNOSIS — I471 Supraventricular tachycardia, unspecified: Secondary | ICD-10-CM

## 2024-02-02 DIAGNOSIS — E785 Hyperlipidemia, unspecified: Secondary | ICD-10-CM

## 2024-02-02 DIAGNOSIS — I2510 Atherosclerotic heart disease of native coronary artery without angina pectoris: Secondary | ICD-10-CM

## 2024-02-02 DIAGNOSIS — I10 Essential (primary) hypertension: Secondary | ICD-10-CM

## 2024-02-02 DIAGNOSIS — Z8673 Personal history of transient ischemic attack (TIA), and cerebral infarction without residual deficits: Secondary | ICD-10-CM

## 2024-02-02 NOTE — Progress Notes (Unsigned)
 Cardiology Office Note:  .   Date: 02/02/2024 ID:  Roy White, DOB 04-29-1951, MRN [id number] PCP: Lennox Pippins, NP  [facility] Providers Cardiologist:  Dina Rich, MD    History of Present Illness: .   Roy White is a 73 y.o. male with a PMH of CAD, s/p CABG in 2011 (SVG-OMB 2, LIMA-LAD, SVG-PDA), HFmrEF, hypertension, hyperlipidemia, history of CVA (07/2023), who presents today for follow-up.  Nuclear stress test in 2016 showed inferolateral scar with mild peri-infarct ischemia, LVEF 47%.  Echocardiogram in October 24 showed EF 40 to 45%, grade 2 DD, mild AI.  Hospitalized in October 24 at [facility] for CVA.  MRI of brain revealed acute to subacute infarcts in both cerebral and cerebellar hemispheres varying ages, remote infarcts and background chronic small vessel ischemic changes also noted.  Last seen by Dr. Dina Rich on August 25, 2023 shortly after hospital discharge.  He was wearing a 2-week ZIO monitor at the time.  Was started on Toprol 12.5 mg daily and was instructed to stop lisinopril and wait 48 hours and then begin low-dose Entresto.  Dr. Wyline Mood recommended titrating medications over follow-up and suggested the patient would not be able to tolerate higher dose beta-blocker dosing due to heart rates, recommended to add MRA/SGLT2 inhibitor over time and to repeat echocardiogram in 3 to 6 months.  If persistent dysfunction on repeat echo, could consider pursuing ischemic evaluation.  It was noted the patient had missed his follow-up with neurology, was instructed to follow-up with them.  Monitor from [facility] showed predominant sinus rhythm,   10/01/2023 - Today presents for 1 month follow-up.  He states he is doing well.  Denies any acute cardiac complaints or issues.  And Entresto and continues to take lisinopril due to the cost of Entresto. Denies any chest pain, shortness of breath, palpitations, syncope, presyncope, dizziness, orthopnea, PND,  swelling or significant weight changes, acute bleeding, or claudication.  Weight is stable.  Compliant with his medications.  12/21/2023 -he presents today for follow-up.  Stopped carvedilol and rosuvastatin due to running out of these medications and not refilling.  Overall doing well from a cardiac perspective denies any acute cardiac complaints or issues. Denies any chest pain, shortness of breath, palpitations, syncope, presyncope, dizziness, orthopnea, PND, swelling or significant weight changes, acute bleeding, or claudication.  02/02/2024 - Presents today for follow-up. Tolerating medications well. Denies any chest pain, shortness of breath, palpitations, syncope, presyncope, dizziness, orthopnea, PND, swelling or significant weight changes, acute bleeding, or claudication.  ROS: Negative.  See HPI.  Studies Reviewed: Marland Kitchen    EKG: EKG is not ordered today.   ZIO XT monitor [facility] 08/2023: Conclusions:  - Ambulatory ECG monitoring was performed from 08/11/23 to 08/25/23, with  ~14 days of analyzable data.  -  The predominant rhythm was sinus rhythm, with sinus rate ranging from  42 to 139 and averaging 67 bpm.  -  Occasional PACs were recorded with  1.1% burden.  -  44 episodes of asymptomatic SVT occurred, the run with the fastest  interval lasting 7 beats with a max rate of 176 bpm, the longest lasting  16.6 secs with an avg rate of 97 bpm.  -  Occasional PVCs were recorded with  1.1% burden.  -  No episodes of wide complex tachycardia occurred.  -  There were no diary entries or patient triggered events.  -  No pauses >3 seconds or high-grade AV block detected.  -  No atrial fibrillation detected.   07/2023 Echo at [facility]: 1. The left ventricle is normal in size with normal wall thickness.    2. The left ventricular systolic function is normal, LVEF is visually  estimated at 40-45%.    3. There is grade II diastolic dysfunction (elevated filling pressure).    4. The mitral valve  leaflets are mildly thickened with normal leaflet  mobility.   5. The aortic valve is trileaflet with mildly thickened leaflets with normal  excursion.   6. There is mild aortic regurgitation.    7. The left atrium is mildly to moderately dilated in size.    8. The right ventricle is normal in size, with normal systolic function.    9. There is no evidence of an interatrial flow communication or  intrapulmonary shunt by agitated saline study.    Lexiscan 02/2015: Conclusion  Low to intermediate risk study (based on ejection fraction). Inferolateral scar with mild peri-infarct ischemia. LVEF 47% with mild global hypokinesis.    Physical Exam:   VS:  BP 132/80   Pulse 60   Ht 5\' 4"  (1.626 m)   Wt 147 lb 12.8 oz (67 kg)   SpO2 98%   BMI 25.37 kg/m    Wt Readings from Last 3 Encounters:  02/02/24 147 lb 12.8 oz (67 kg)  12/21/23 149 lb (67.6 kg)  10/01/23 153 lb 3.2 oz (69.5 kg)    GEN: Well nourished, well developed in no acute distress NECK: No JVD; No carotid bruits CARDIAC: S1/S2, RRR, no murmurs, rubs, gallops RESPIRATORY:  Clear to auscultation without rales, wheezing or rhonchi  ABDOMEN: Soft, non-tender, non-distended EXTREMITIES:  No edema; No deformity   ASSESSMENT AND PLAN: .    HFmrEF Stage C, NYHA class I symptoms.  EF from October 2024 Echo at [facility] was mildly reduced at 40 to 45%, grade 2 DD. Euvolemic and well compensated on exam.  Has some affordability issues with Entresto so we will continue lisinopril.  Continue current GDMT. GDMT limited d/t current BP. Low sodium diet, fluid restriction <2L, and daily weights encouraged. Educated to contact our office for weight gain of 2 lbs overnight or 5 lbs in one week. Will obtain CMET and FLP. If kidney fxn allows, plan to begin SGLT2i.   CAD, s/p CABG in 2011 Stable with no anginal symptoms. No indication for ischemic evaluation.  Continue aspirin, Coreg, lisinopril, and rosuvastatin.  Heart healthy diet and regular  cardiovascular exercise encouraged. Care and ED precautions discussed.   3.  Hyperlipidemia Most recent LDL from October 2024 was 66 and doing well since restarting Crestor. Will obtain FLP and CMET.  Heart healthy diet and regular cardiovascular exercise encouraged.    4.  Hypertension, bradycardia, SVT BP stable. Discussed to monitor BP at home at least 2 hours after medications and sitting for 5-10 minutes. Monitor report noted above-had some asymptomatic SVT episodes, longest episode was brief in duration.  No A-fib, pauses, high degree heart blocks, or significant arrhythmias noted on report. Doing well. Denies any symptoms. Continue current medication regimen. Heart healthy diet and regular cardiovascular exercise encouraged.   5.  History of CVA Denies any symptoms. Continue current medication regimen.  Past monitor did not reveal any A-fib.  Follow-up with PCP and Neurology.    Dispo: Follow-up with me/APP in 2-3 months or sooner if anything changes.  Signed, Sharlene Dory, NP

## 2024-02-02 NOTE — Patient Instructions (Addendum)
 Medication Instructions:  Your physician recommends that you continue on your current medications as directed. Please refer to the Current Medication list given to you today.  Labwork: In 1-2 weeks at [facility]   Testing/Procedures: None   Follow-Up: Your physician recommends that you schedule a follow-up appointment in: 2-3 months   Any Other Special Instructions Will Be Listed Below (If Applicable).  If you need a refill on your cardiac medications before your next appointment, please call your pharmacy.

## 2024-04-26 ENCOUNTER — Ambulatory Visit: Payer: Self-pay

## 2024-04-26 DIAGNOSIS — Z79899 Other long term (current) drug therapy: Secondary | ICD-10-CM

## 2024-04-26 DIAGNOSIS — I5022 Chronic systolic (congestive) heart failure: Secondary | ICD-10-CM

## 2024-05-09 ENCOUNTER — Other Ambulatory Visit (HOSPITAL_COMMUNITY)
Admission: RE | Admit: 2024-05-09 | Discharge: 2024-05-09 | Disposition: A | Discharge status: Home / Self Care | Payer: Self-pay | Source: Ambulatory Visit | Attending: Nurse Practitioner | Admitting: Nurse Practitioner

## 2024-05-09 DIAGNOSIS — I5022 Chronic systolic (congestive) heart failure: Secondary | ICD-10-CM | POA: Insufficient documentation

## 2024-05-09 DIAGNOSIS — Z79899 Other long term (current) drug therapy: Secondary | ICD-10-CM | POA: Insufficient documentation

## 2024-05-09 LAB — BASIC METABOLIC PANEL WITH GFR
Anion gap: 13 (ref 5–15)
BUN: 15 mg/dL (ref 8–23)
CO2: 22 mmol/L (ref 22–32)
Calcium: 9.2 mg/dL (ref 8.9–10.3)
Chloride: 103 mmol/L (ref 98–111)
Creatinine, Ser: 1.25 mg/dL — ABNORMAL HIGH (ref 0.61–1.24)
GFR, Estimated: >60 mL/min (ref >60)
Glucose, Bld: 107 mg/dL — ABNORMAL HIGH (ref 70–99)
Potassium: 3.9 mmol/L (ref 3.5–5.1)
Sodium: 138 mmol/L (ref 135–145)

## 2024-05-10 ENCOUNTER — Ambulatory Visit: Payer: Self-pay | Attending: Nurse Practitioner | Admitting: Nurse Practitioner

## 2024-05-10 ENCOUNTER — Encounter: Payer: Self-pay | Admitting: Nurse Practitioner

## 2024-05-10 VITALS — BP 142/80 | HR 66 | Ht 64.0 in | Wt 148.4 lb

## 2024-05-10 DIAGNOSIS — I2510 Atherosclerotic heart disease of native coronary artery without angina pectoris: Secondary | ICD-10-CM

## 2024-05-10 DIAGNOSIS — I471 Supraventricular tachycardia, unspecified: Secondary | ICD-10-CM

## 2024-05-10 DIAGNOSIS — Z79899 Other long term (current) drug therapy: Secondary | ICD-10-CM

## 2024-05-10 DIAGNOSIS — Z8673 Personal history of transient ischemic attack (TIA), and cerebral infarction without residual deficits: Secondary | ICD-10-CM

## 2024-05-10 DIAGNOSIS — R001 Bradycardia, unspecified: Secondary | ICD-10-CM

## 2024-05-10 DIAGNOSIS — I5022 Chronic systolic (congestive) heart failure: Secondary | ICD-10-CM

## 2024-05-10 DIAGNOSIS — E785 Hyperlipidemia, unspecified: Secondary | ICD-10-CM

## 2024-05-10 DIAGNOSIS — I10 Essential (primary) hypertension: Secondary | ICD-10-CM

## 2024-05-10 MED ORDER — SACUBITRIL-VALSARTAN 24-26 MG PO TABS: 1.0000 | ORAL_TABLET | Freq: Two times a day (BID) | ORAL | 5 refills | Status: DC

## 2024-05-10 MED ORDER — ROSUVASTATIN CALCIUM 40 MG PO TABS: 40.0000 mg | ORAL_TABLET | Freq: Every day | ORAL | 1 refills | Status: DC

## 2024-05-10 NOTE — Patient Instructions (Addendum)
 Medication Instructions:  Your physician has recommended you make the following change in your medication:  Please Stop Lisinopril   Please start Entresto  24-26 Mg Twice daily ( Start Saturday7/19/25)  Please Increase Crestor  to 40 Mg daily   Labwork: In 2-3 weeks at [facility]  In 3 months at [facility]   Testing/Procedures: None   Follow-Up: Your physician recommends that you schedule a follow-up appointment in: 6-8 weeks   Any Other Special Instructions Will Be Listed Below (If Applicable).  If you need a refill on your cardiac medications before your next appointment, please call your pharmacy.

## 2024-05-10 NOTE — Progress Notes (Unsigned)
 Cardiology Office Note:  .   Date: 05/10/2024 ID:  Roy White, DOB 04-03-1951, MRN [id number] PCP: Gretta Shona POUR, NP   [facility] Providers Cardiologist:  Alvan Carrier, MD    History of Present Illness: .   Roy White is a 74 y.o. male with a PMH of CAD, s/p CABG in 2011 (SVG-OMB 2, LIMA-LAD, SVG-PDA), HFmrEF, hypertension, hyperlipidemia, history of CVA (07/2023), who presents today for follow-up.  Nuclear stress test in 2016 showed inferolateral scar with mild peri-infarct ischemia, LVEF 47%.  Echocardiogram in October 24 showed EF 40 to 45%, grade 2 DD, mild AI.  Hospitalized in October 24 at [facility] for CVA.  MRI of brain revealed acute to subacute infarcts in both cerebral and cerebellar hemispheres varying ages, remote infarcts and background chronic small vessel ischemic changes also noted.  Last seen by Dr. Carrier Alvan on August 25, 2023 shortly after hospital discharge.  He was wearing a 2-week ZIO monitor at the time.  Was started on Toprol 12.5 mg daily and was instructed to stop lisinopril  and wait 48 hours and then begin low-dose Entresto .  Dr. Alvan recommended titrating medications over follow-up and suggested the patient would not be able to tolerate higher dose beta-blocker dosing due to heart rates, recommended to add MRA/SGLT2 inhibitor over time and to repeat echocardiogram in 3 to 6 months.  If persistent dysfunction on repeat echo, could consider pursuing ischemic evaluation.  It was noted the patient had missed his follow-up with neurology, was instructed to follow-up with them.  Monitor from [facility] showed predominant sinus rhythm,   10/01/2023 - Today presents for 1 month follow-up.  He states he is doing well.  Denies any acute cardiac complaints or issues.  And Entresto  and continues to take lisinopril  due to the cost of Entresto . Denies any chest pain, shortness of breath, palpitations, syncope, presyncope, dizziness, orthopnea, PND,  swelling or significant weight changes, acute bleeding, or claudication.  Weight is stable.  Compliant with his medications.  12/21/2023 -he presents today for follow-up.  Stopped carvedilol  and rosuvastatin  due to running out of these medications and not refilling.  Overall doing well from a cardiac perspective denies any acute cardiac complaints or issues. Denies any chest pain, shortness of breath, palpitations, syncope, presyncope, dizziness, orthopnea, PND, swelling or significant weight changes, acute bleeding, or claudication.  02/02/2024 - Presents today for follow-up. Tolerating medications well. Denies any chest pain, shortness of breath, palpitations, syncope, presyncope, dizziness, orthopnea, PND, swelling or significant weight changes, acute bleeding, or claudication.  05/10/2024 -  Here for follow-up. Doing well. Denies any acute cardiac complaints or issues. He is tolerating his medications well. Denies any chest pain, shortness of breath, palpitations, syncope, presyncope, dizziness, orthopnea, PND, swelling or significant weight changes, acute bleeding, or claudication.  ROS: Negative.  See HPI.  Studies Reviewed: SABRA    EKG:  EKG Interpretation Date/Time:  Tuesday May 10 2024 15:39:13 EDT Ventricular Rate:  68 PR Interval:  168 QRS Duration:  96 QT Interval:  428 QTC Calculation: 455 R Axis:   57  Text Interpretation: Sinus rhythm with marked sinus arrhythmia Inferior infarct (cited on or before 25-Aug-2023) ST & T wave abnormality, consider lateral ischemia When compared with ECG of 25-Aug-2023 09:00, Premature ventricular complexes are no longer Present Confirmed by Miriam Norris [phone]) on 05/12/2024 12:23:40 PM    ZIO XT monitor [facility] 08/2023: Conclusions:  - Ambulatory ECG monitoring was performed from 08/11/23 to 08/25/23, with  ~14 days of  analyzable data.  -  The predominant rhythm was sinus rhythm, with sinus rate ranging from  42 to 139 and averaging 67 bpm.  -   Occasional PACs were recorded with  1.1% burden.  -  44 episodes of asymptomatic SVT occurred, the run with the fastest  interval lasting 7 beats with a max rate of 176 bpm, the longest lasting  16.6 secs with an avg rate of 97 bpm.  -  Occasional PVCs were recorded with  1.1% burden.  -  No episodes of wide complex tachycardia occurred.  -  There were no diary entries or patient triggered events.  -  No pauses >3 seconds or high-grade AV block detected.  -  No atrial fibrillation detected.   07/2023 Echo at [facility]: 1. The left ventricle is normal in size with normal wall thickness.    2. The left ventricular systolic function is normal, LVEF is visually  estimated at 40-45%.    3. There is grade II diastolic dysfunction (elevated filling pressure).    4. The mitral valve leaflets are mildly thickened with normal leaflet  mobility.   5. The aortic valve is trileaflet with mildly thickened leaflets with normal  excursion.   6. There is mild aortic regurgitation.    7. The left atrium is mildly to moderately dilated in size.    8. The right ventricle is normal in size, with normal systolic function.    9. There is no evidence of an interatrial flow communication or  intrapulmonary shunt by agitated saline study.    Lexiscan 02/2015: Conclusion  Low to intermediate risk study (based on ejection fraction). Inferolateral scar with mild peri-infarct ischemia. LVEF 47% with mild global hypokinesis.    Physical Exam:   VS:  BP (!) 142/80   Pulse 66   Ht 5' 4 (1.626 m)   Wt 148 lb 6.4 oz (67.3 kg)   SpO2 96%   BMI 25.47 kg/m    Wt Readings from Last 3 Encounters:  05/10/24 148 lb 6.4 oz (67.3 kg)  02/02/24 147 lb 12.8 oz (67 kg)  12/21/23 149 lb (67.6 kg)    GEN: Well nourished, well developed in no acute distress NECK: No JVD; No carotid bruits CARDIAC: S1/S2, RRR, no murmurs, rubs, gallops RESPIRATORY:  Clear to auscultation without rales, wheezing or rhonchi  ABDOMEN: Soft,  non-tender, non-distended EXTREMITIES:  No edema; No deformity   ASSESSMENT AND PLAN: .    HFmrEF Stage C, NYHA class I symptoms.  EF from October 2024 Echo at [facility] was mildly reduced at 40 to 45%, grade 2 DD. Euvolemic and well compensated on exam.  Will stop Lisinopril  and trial starting Entresto  24/26 mg BID.  Continue rest of GDMT.  Low sodium diet, fluid restriction <2L, and daily weights encouraged. Educated to contact our office for weight gain of 2 lbs overnight or 5 lbs in one week. Will obtain BMET. If kidney fxn allows at next office visit, plan to begin SGLT2i.   CAD, s/p CABG in 2011 Stable with no anginal symptoms. No indication for ischemic evaluation.  Continue aspirin, Coreg , and rosuvastatin .  Stopping Lisinopril  and starting low dose Entresto  and obtaining a BMET as noted above. Heart healthy diet and regular cardiovascular exercise encouraged. Care and ED precautions discussed.   3.  Hyperlipidemia, medication management Most recent LDL from 01/2024 was 124, reports compliance with medication. Will increase Crestor  to 40 mg daily and obtain FLP/LFT in 3 months. Heart healthy diet and regular  cardiovascular exercise encouraged.    4.  Hypertension, bradycardia, SVT SBP averaging 140's per his report. Discussed to monitor BP at home at least 2 hours after medications and sitting for 5-10 minutes. Monitor report noted above-had some asymptomatic SVT episodes, longest episode was brief in duration.  No A-fib, pauses, high degree heart blocks, or significant arrhythmias noted on report. Doing well. Denies any symptoms. Continue medication regimen as stated above. Heart healthy diet and regular cardiovascular exercise encouraged.   5.  History of CVA Denies any symptoms. Continue medication regimen as noted above.  Past monitor did not reveal any A-fib.  Follow-up with PCP and Neurology.    Dispo: Follow-up with me/APP in 6-8 weeks or sooner if anything changes.  Signed, Almarie Crate, NP

## 2024-07-01 ENCOUNTER — Encounter: Payer: Self-pay | Admitting: Nurse Practitioner

## 2024-07-01 ENCOUNTER — Ambulatory Visit: Payer: Self-pay | Attending: Nurse Practitioner | Admitting: Nurse Practitioner

## 2024-07-01 VITALS — BP 118/70 | HR 51 | Ht 64.0 in | Wt 144.2 lb

## 2024-07-01 DIAGNOSIS — I5022 Chronic systolic (congestive) heart failure: Secondary | ICD-10-CM

## 2024-07-01 DIAGNOSIS — R001 Bradycardia, unspecified: Secondary | ICD-10-CM

## 2024-07-01 DIAGNOSIS — I2510 Atherosclerotic heart disease of native coronary artery without angina pectoris: Secondary | ICD-10-CM

## 2024-07-01 DIAGNOSIS — Z8673 Personal history of transient ischemic attack (TIA), and cerebral infarction without residual deficits: Secondary | ICD-10-CM

## 2024-07-01 DIAGNOSIS — Z79899 Other long term (current) drug therapy: Secondary | ICD-10-CM

## 2024-07-01 DIAGNOSIS — I10 Essential (primary) hypertension: Secondary | ICD-10-CM

## 2024-07-01 DIAGNOSIS — I471 Supraventricular tachycardia, unspecified: Secondary | ICD-10-CM

## 2024-07-01 DIAGNOSIS — E785 Hyperlipidemia, unspecified: Secondary | ICD-10-CM

## 2024-07-01 NOTE — Patient Instructions (Addendum)
 Medication Instructions:  Your physician recommends that you continue on your current medications as directed. Please refer to the Current Medication list given to you today.  Labwork: Today at [facility]   Testing/Procedures: None   Follow-Up: Your physician recommends that you schedule a follow-up appointment in: 6-8 weeks   Any Other Special Instructions Will Be Listed Below (If Applicable).  If you need a refill on your cardiac medications before your next appointment, please call your pharmacy.

## 2024-07-01 NOTE — Progress Notes (Signed)
 Cardiology Office Note:  .   Date: 07/01/2024 ID:  Roy White, DOB 1951-05-08, MRN [id number] PCP: Gretta Shona POUR, NP  [facility] Providers Cardiologist:  Alvan Carrier, MD    History of Present Illness: .   Roy White is a 73 y.o. male with a PMH of CAD, s/p CABG in 2011 (SVG-OMB 2, LIMA-LAD, SVG-PDA), HFmrEF, hypertension, hyperlipidemia, history of CVA (07/2023), who presents today for follow-up.  Nuclear stress test in 2016 showed inferolateral scar with mild peri-infarct ischemia, LVEF 47%.  Echocardiogram in October 24 showed EF 40 to 45%, grade 2 DD, mild AI.  Hospitalized in October 24 at [facility] for CVA.  MRI of brain revealed acute to subacute infarcts in both cerebral and cerebellar hemispheres varying ages, remote infarcts and background chronic small vessel ischemic changes also noted.  Last seen by Dr. Carrier Alvan on August 25, 2023 shortly after hospital discharge.  He was wearing a 2-week ZIO monitor at the time.  Was started on Toprol 12.5 mg daily and was instructed to stop lisinopril  and wait 48 hours and then begin low-dose Entresto .  Dr. Alvan recommended titrating medications over follow-up and suggested the patient would not be able to tolerate higher dose beta-blocker dosing due to heart rates, recommended to add MRA/SGLT2 inhibitor over time and to repeat echocardiogram in 3 to 6 months.  If persistent dysfunction on repeat echo, could consider pursuing ischemic evaluation.  It was noted the patient had missed his follow-up with neurology, was instructed to follow-up with them.  Monitor from [facility] showed predominant sinus rhythm,   10/01/2023 - Today presents for 1 month follow-up.  He states he is doing well.  Denies any acute cardiac complaints or issues.  And Entresto  and continues to take lisinopril  due to the cost of Entresto . Denies any chest pain, shortness of breath, palpitations, syncope, presyncope, dizziness, orthopnea, PND,  swelling or significant weight changes, acute bleeding, or claudication.  Weight is stable.  Compliant with his medications.  12/21/2023 -he presents today for follow-up.  Stopped carvedilol  and rosuvastatin  due to running out of these medications and not refilling.  Overall doing well from a cardiac perspective denies any acute cardiac complaints or issues. Denies any chest pain, shortness of breath, palpitations, syncope, presyncope, dizziness, orthopnea, PND, swelling or significant weight changes, acute bleeding, or claudication.  02/02/2024 - Presents today for follow-up. Tolerating medications well. Denies any chest pain, shortness of breath, palpitations, syncope, presyncope, dizziness, orthopnea, PND, swelling or significant weight changes, acute bleeding, or claudication.  05/10/2024 -  Here for follow-up. Doing well. Denies any acute cardiac complaints or issues. He is tolerating his medications well. Denies any chest pain, shortness of breath, palpitations, syncope, presyncope, dizziness, orthopnea, PND, swelling or significant weight changes, acute bleeding, or claudication.  07/01/2024 - Doing well.  Only recent complaint today is he has had a recent flareup of gout and colchicine is helping with this.  Tolerating his cardiac medications well.  Denies any acute cardiac complaints or issues. Denies any chest pain, shortness of breath, palpitations, syncope, presyncope, dizziness, orthopnea, PND, significant weight changes, acute bleeding, or claudication.  ROS: Negative.  See HPI.  Studies Reviewed: SABRA    EKG: EKG is not ordered today.       ZIO XT monitor [facility] 08/2023: Conclusions:  - Ambulatory ECG monitoring was performed from 08/11/23 to 08/25/23, with  ~14 days of analyzable data.  -  The predominant rhythm was sinus rhythm, with sinus rate ranging  from  42 to 139 and averaging 67 bpm.  -  Occasional PACs were recorded with  1.1% burden.  -  44 episodes of asymptomatic SVT occurred,  the run with the fastest  interval lasting 7 beats with a max rate of 176 bpm, the longest lasting  16.6 secs with an avg rate of 97 bpm.  -  Occasional PVCs were recorded with  1.1% burden.  -  No episodes of wide complex tachycardia occurred.  -  There were no diary entries or patient triggered events.  -  No pauses >3 seconds or high-grade AV block detected.  -  No atrial fibrillation detected.   07/2023 Echo at [facility]: 1. The left ventricle is normal in size with normal wall thickness.    2. The left ventricular systolic function is normal, LVEF is visually  estimated at 40-45%.    3. There is grade II diastolic dysfunction (elevated filling pressure).    4. The mitral valve leaflets are mildly thickened with normal leaflet  mobility.   5. The aortic valve is trileaflet with mildly thickened leaflets with normal  excursion.   6. There is mild aortic regurgitation.    7. The left atrium is mildly to moderately dilated in size.    8. The right ventricle is normal in size, with normal systolic function.    9. There is no evidence of an interatrial flow communication or  intrapulmonary shunt by agitated saline study.    Lexiscan 02/2015: Conclusion  Low to intermediate risk study (based on ejection fraction). Inferolateral scar with mild peri-infarct ischemia. LVEF 47% with mild global hypokinesis.    Physical Exam:   VS:  BP 118/70 (BP Location: Left Arm)   Pulse (!) 51   Ht 5' 4 (1.626 m)   Wt 144 lb 3.2 oz (65.4 kg)   SpO2 98%   BMI 24.75 kg/m    Wt Readings from Last 3 Encounters:  07/01/24 144 lb 3.2 oz (65.4 kg)  05/10/24 148 lb 6.4 oz (67.3 kg)  02/02/24 147 lb 12.8 oz (67 kg)    GEN: Well nourished, well developed in no acute distress NECK: No JVD; No carotid bruits CARDIAC: S1/S2, RRR, no murmurs, rubs, gallops RESPIRATORY:  Clear to auscultation without rales, wheezing or rhonchi  ABDOMEN: Soft, non-tender, non-distended EXTREMITIES:  No edema; No deformity    ASSESSMENT AND PLAN: .    HFmrEF, medication management Stage C, NYHA class I symptoms.  EF from October 2024 Echo at [facility] was mildly reduced at 40 to 45%, grade 2 DD. Euvolemic and well compensated on exam.  He is tolerating low-dose Entresto  well.  Will obtain BMET today at [facility] and if kidney function permits, will start Farxiga.  Continue rest of GDMT.  Low sodium diet, fluid restriction <2L, and daily weights encouraged. Educated to contact our office for weight gain of 2 lbs overnight or 5 lbs in one week.   CAD, s/p CABG in 2011 Stable with no anginal symptoms. No indication for ischemic evaluation.  Continue aspirin, Coreg , and rosuvastatin .  Will obtain BMET as noted above to see if Farxiga can be started.  Heart healthy diet and regular cardiovascular exercise encouraged. Care and ED precautions discussed.   3.  Hyperlipidemia Most recent LDL from 01/2024 was 124, reports compliance with medication.Doing well on increased dosage of Crestor  and will obtain FLP/LFT at next office visit. Heart healthy diet and regular cardiovascular exercise encouraged.    4.  Hypertension, bradycardia, SVT SBP  averaging 140's per his report. Discussed to monitor BP at home at least 2 hours after medications and sitting for 5-10 minutes. Monitor report noted above-had some asymptomatic SVT episodes, longest episode was brief in duration.  No A-fib, pauses, high degree heart blocks, or significant arrhythmias noted on report. Doing well. Denies any symptoms. Continue medication regimen as stated above. Heart healthy diet and regular cardiovascular exercise encouraged.   5.  History of CVA Denies any symptoms. Continue medication regimen as noted above.  Past monitor did not reveal any A-fib.  Follow-up with PCP and Neurology.    Dispo: Follow-up with me/APP in 6-8 weeks or sooner if anything changes.  Signed, Almarie Crate, NP

## 2024-08-02 ENCOUNTER — Ambulatory Visit: Payer: Self-pay | Admitting: Nurse Practitioner

## 2024-08-04 ENCOUNTER — Telehealth: Payer: Self-pay | Admitting: Cardiology

## 2024-08-04 ENCOUNTER — Encounter: Payer: Self-pay | Admitting: *Deleted

## 2024-08-04 NOTE — Telephone Encounter (Signed)
 Patient returning call for results

## 2024-08-04 NOTE — Telephone Encounter (Signed)
 Results discussed with patient.Per E.Peck,NP all labs looked good.

## 2024-08-23 ENCOUNTER — Encounter: Payer: Self-pay | Admitting: Nurse Practitioner

## 2024-08-23 ENCOUNTER — Ambulatory Visit: Payer: Self-pay | Attending: Nurse Practitioner | Admitting: Nurse Practitioner

## 2024-08-23 VITALS — BP 130/80 | HR 61 | Ht 64.0 in | Wt 145.2 lb

## 2024-08-23 DIAGNOSIS — Z8673 Personal history of transient ischemic attack (TIA), and cerebral infarction without residual deficits: Secondary | ICD-10-CM

## 2024-08-23 DIAGNOSIS — Z79899 Other long term (current) drug therapy: Secondary | ICD-10-CM

## 2024-08-23 DIAGNOSIS — I2510 Atherosclerotic heart disease of native coronary artery without angina pectoris: Secondary | ICD-10-CM

## 2024-08-23 DIAGNOSIS — I10 Essential (primary) hypertension: Secondary | ICD-10-CM

## 2024-08-23 DIAGNOSIS — I471 Supraventricular tachycardia, unspecified: Secondary | ICD-10-CM

## 2024-08-23 DIAGNOSIS — R001 Bradycardia, unspecified: Secondary | ICD-10-CM

## 2024-08-23 DIAGNOSIS — E785 Hyperlipidemia, unspecified: Secondary | ICD-10-CM

## 2024-08-23 DIAGNOSIS — I5020 Unspecified systolic (congestive) heart failure: Secondary | ICD-10-CM

## 2024-08-23 MED ORDER — DAPAGLIFLOZIN PROPANEDIOL 10 MG PO TABS: 10.0000 mg | ORAL_TABLET | Freq: Every day | ORAL | 3 refills | Status: DC

## 2024-08-23 MED ORDER — ROSUVASTATIN CALCIUM 40 MG PO TABS: 40.0000 mg | ORAL_TABLET | Freq: Every day | ORAL | 1 refills | Status: AC

## 2024-08-23 NOTE — Progress Notes (Unsigned)
 Cardiology Office Note:  .   Date: 07/01/2024 ID:  Roy White, DOB Mar 05, 1951, MRN [id number] PCP: Gretta Shona POUR, NP  [facility] Providers Cardiologist:  Alvan Carrier, MD    History of Present Illness: .   Roy White is a 73 y.o. male with a PMH of CAD, s/p CABG in 2011 (SVG-OMB 2, LIMA-LAD, SVG-PDA), HFmrEF, hypertension, hyperlipidemia, history of CVA (07/2023), who presents today for follow-up.  Nuclear stress test in 2016 showed inferolateral scar with mild peri-infarct ischemia, LVEF 47%.  Echocardiogram in October 24 showed EF 40 to 45%, grade 2 DD, mild AI.  Hospitalized in October 24 at [facility] for CVA.  MRI of brain revealed acute to subacute infarcts in both cerebral and cerebellar hemispheres varying ages, remote infarcts and background chronic small vessel ischemic changes also noted.  Last seen by Dr. Carrier Alvan on August 25, 2023 shortly after hospital discharge.  He was wearing a 2-week ZIO monitor at the time.  Was started on Toprol 12.5 mg daily and was instructed to stop lisinopril  and wait 48 hours and then begin low-dose Entresto .  Dr. Alvan recommended titrating medications over follow-up and suggested the patient would not be able to tolerate higher dose beta-blocker dosing due to heart rates, recommended to add MRA/SGLT2 inhibitor over time and to repeat echocardiogram in 3 to 6 months.  If persistent dysfunction on repeat echo, could consider pursuing ischemic evaluation.  It was noted the patient had missed his follow-up with neurology, was instructed to follow-up with them.  Monitor from [facility] showed predominant sinus rhythm,   10/01/2023 - Today presents for 1 month follow-up.  He states he is doing well.  Denies any acute cardiac complaints or issues.  And Entresto  and continues to take lisinopril  due to the cost of Entresto . Denies any chest pain, shortness of breath, palpitations, syncope, presyncope, dizziness, orthopnea, PND,  swelling or significant weight changes, acute bleeding, or claudication.  Weight is stable.  Compliant with his medications.  12/21/2023 -he presents today for follow-up.  Stopped carvedilol  and rosuvastatin  due to running out of these medications and not refilling.  Overall doing well from a cardiac perspective denies any acute cardiac complaints or issues. Denies any chest pain, shortness of breath, palpitations, syncope, presyncope, dizziness, orthopnea, PND, swelling or significant weight changes, acute bleeding, or claudication.  02/02/2024 - Presents today for follow-up. Tolerating medications well. Denies any chest pain, shortness of breath, palpitations, syncope, presyncope, dizziness, orthopnea, PND, swelling or significant weight changes, acute bleeding, or claudication.  05/10/2024 -  Here for follow-up. Doing well. Denies any acute cardiac complaints or issues. He is tolerating his medications well. Denies any chest pain, shortness of breath, palpitations, syncope, presyncope, dizziness, orthopnea, PND, swelling or significant weight changes, acute bleeding, or claudication.  07/01/2024 - Doing well.  Only recent complaint today is he has had a recent flareup of gout and colchicine is helping with this.  Tolerating his cardiac medications well.  Denies any acute cardiac complaints or issues. Denies any chest pain, shortness of breath, palpitations, syncope, presyncope, dizziness, orthopnea, PND, significant weight changes, acute bleeding, or claudication.  ROS: Negative.  See HPI.  Studies Reviewed: SABRA    EKG: EKG is not ordered today.       ZIO XT monitor [facility] 08/2023: Conclusions:  - Ambulatory ECG monitoring was performed from 08/11/23 to 08/25/23, with  ~14 days of analyzable data.  -  The predominant rhythm was sinus rhythm, with sinus rate ranging  from  42 to 139 and averaging 67 bpm.  -  Occasional PACs were recorded with  1.1% burden.  -  44 episodes of asymptomatic SVT occurred,  the run with the fastest  interval lasting 7 beats with a max rate of 176 bpm, the longest lasting  16.6 secs with an avg rate of 97 bpm.  -  Occasional PVCs were recorded with  1.1% burden.  -  No episodes of wide complex tachycardia occurred.  -  There were no diary entries or patient triggered events.  -  No pauses >3 seconds or high-grade AV block detected.  -  No atrial fibrillation detected.   07/2023 Echo at [facility]: 1. The left ventricle is normal in size with normal wall thickness.    2. The left ventricular systolic function is normal, LVEF is visually  estimated at 40-45%.    3. There is grade II diastolic dysfunction (elevated filling pressure).    4. The mitral valve leaflets are mildly thickened with normal leaflet  mobility.   5. The aortic valve is trileaflet with mildly thickened leaflets with normal  excursion.   6. There is mild aortic regurgitation.    7. The left atrium is mildly to moderately dilated in size.    8. The right ventricle is normal in size, with normal systolic function.    9. There is no evidence of an interatrial flow communication or  intrapulmonary shunt by agitated saline study.    Lexiscan 02/2015: Conclusion  Low to intermediate risk study (based on ejection fraction). Inferolateral scar with mild peri-infarct ischemia. LVEF 47% with mild global hypokinesis.    Physical Exam:   VS:  There were no vitals taken for this visit.   Wt Readings from Last 3 Encounters:  07/01/24 144 lb 3.2 oz (65.4 kg)  05/10/24 148 lb 6.4 oz (67.3 kg)  02/02/24 147 lb 12.8 oz (67 kg)    GEN: Well nourished, well developed in no acute distress NECK: No JVD; No carotid bruits CARDIAC: S1/S2, RRR, no murmurs, rubs, gallops RESPIRATORY:  Clear to auscultation without rales, wheezing or rhonchi  ABDOMEN: Soft, non-tender, non-distended EXTREMITIES:  No edema; No deformity   ASSESSMENT AND PLAN: .    HFmrEF, medication management Stage C, NYHA class I symptoms.   EF from October 2024 Echo at [facility] was mildly reduced at 40 to 45%, grade 2 DD. Euvolemic and well compensated on exam.  He is tolerating low-dose Entresto  well.  Will obtain BMET today at [facility] and if kidney function permits, will start Farxiga.  Continue rest of GDMT.  Low sodium diet, fluid restriction <2L, and daily weights encouraged. Educated to contact our office for weight gain of 2 lbs overnight or 5 lbs in one week.   CAD, s/p CABG in 2011 Stable with no anginal symptoms. No indication for ischemic evaluation.  Continue aspirin, Coreg , and rosuvastatin .  Will obtain BMET as noted above to see if Farxiga can be started.  Heart healthy diet and regular cardiovascular exercise encouraged. Care and ED precautions discussed.   3.  Hyperlipidemia Most recent LDL from 01/2024 was 124, reports compliance with medication.Doing well on increased dosage of Crestor  and will obtain FLP/LFT at next office visit. Heart healthy diet and regular cardiovascular exercise encouraged.    4.  Hypertension, bradycardia, SVT SBP averaging 140's per his report. Discussed to monitor BP at home at least 2 hours after medications and sitting for 5-10 minutes. Monitor report noted above-had some asymptomatic SVT  episodes, longest episode was brief in duration.  No A-fib, pauses, high degree heart blocks, or significant arrhythmias noted on report. Doing well. Denies any symptoms. Continue medication regimen as stated above. Heart healthy diet and regular cardiovascular exercise encouraged.   5.  History of CVA Denies any symptoms. Continue medication regimen as noted above.  Past monitor did not reveal any A-fib.  Follow-up with PCP and Neurology.    Dispo: Follow-up with me/APP in 6-8 weeks or sooner if anything changes.  Signed, Almarie Crate, NP

## 2024-08-23 NOTE — Patient Instructions (Signed)
 Medication Instructions:  Your physician has recommended you make the following change in your medication:  Start taking Farxiga 10 mg once daily Continue taking all other medications as prescribed   Labwork: BMET 2 week at [facility]  Testing/Procedures: None  Follow-Up: Your physician recommends that you schedule a follow-up appointment in: 3 months  Any Other Special Instructions Will Be Listed Below (If Applicable). Thank you for choosing [facility]!     If you need a refill on your cardiac medications before your next appointment, please call your pharmacy.

## 2024-11-15 ENCOUNTER — Encounter: Payer: Self-pay | Admitting: Nurse Practitioner

## 2024-11-15 ENCOUNTER — Ambulatory Visit: Payer: Self-pay | Attending: Nurse Practitioner | Admitting: Nurse Practitioner

## 2024-11-15 VITALS — BP 180/98 | HR 64 | Ht 64.0 in | Wt 141.0 lb

## 2024-11-15 DIAGNOSIS — E785 Hyperlipidemia, unspecified: Secondary | ICD-10-CM

## 2024-11-15 DIAGNOSIS — I2510 Atherosclerotic heart disease of native coronary artery without angina pectoris: Secondary | ICD-10-CM

## 2024-11-15 DIAGNOSIS — R001 Bradycardia, unspecified: Secondary | ICD-10-CM

## 2024-11-15 DIAGNOSIS — I5020 Unspecified systolic (congestive) heart failure: Secondary | ICD-10-CM

## 2024-11-15 DIAGNOSIS — I10 Essential (primary) hypertension: Secondary | ICD-10-CM

## 2024-11-15 DIAGNOSIS — Z8673 Personal history of transient ischemic attack (TIA), and cerebral infarction without residual deficits: Secondary | ICD-10-CM

## 2024-11-15 DIAGNOSIS — I471 Supraventricular tachycardia, unspecified: Secondary | ICD-10-CM

## 2024-11-15 NOTE — Patient Instructions (Signed)
 Medication Instructions:   STOP Farxiga   Labwork: None today  Testing/Procedures: Your physician has requested that you have an echocardiogram. Echocardiography is a painless test that uses sound waves to create images of your heart. It provides your doctor with information about the size and shape of your heart and how well your hearts chambers and valves are working. This procedure takes approximately one hour. There are no restrictions for this procedure. Please do NOT wear cologne, perfume, aftershave, or lotions (deodorant is allowed). Please arrive 15 minutes prior to your appointment time.  Please note: We ask at that you not bring children with you during ultrasound (echo/ vascular) testing. Due to room size and safety concerns, children are not allowed in the ultrasound rooms during exams. Our front office staff cannot provide observation of children in our lobby area while testing is being conducted. An adult accompanying a patient to their appointment will only be allowed in the ultrasound room at the discretion of the ultrasound technician under special circumstances. We apologize for any inconvenience.   Follow-Up: 6 months with E.Peck,NP  Any Other Special Instructions Will Be Listed Below (If Applicable).  If you need a refill on your cardiac medications before your next appointment, please call your pharmacy.

## 2024-11-15 NOTE — Progress Notes (Signed)
 " Cardiology Office Note:  .   Date: 11/15/2024 ID:  Roy White, DOB [birth year], MRN [id number] PCP: No primary care provider on file.   [facility] Providers Cardiologist:  Alvan Carrier, MD    History of Present Illness: .   Roy White is a 74 y.o. male with a PMH of CAD, s/p CABG in 2011 (SVG-OMB 2, LIMA-LAD, SVG-PDA), HFmrEF, hypertension, hyperlipidemia, history of CVA (07/2023), who presents today for follow-up.  Nuclear stress test in 2016 showed inferolateral scar with mild peri-infarct ischemia, LVEF 47%.  Echocardiogram in October 24 showed EF 40 to 45%, grade 2 DD, mild AI.  Hospitalized in October 24 at [facility] for CVA.  MRI of brain revealed acute to subacute infarcts in both cerebral and cerebellar hemispheres varying ages, remote infarcts and background chronic small vessel ischemic changes also noted.  Last seen by Dr. Carrier Alvan on August 25, 2023 shortly after hospital discharge.  He was wearing a 2-week ZIO monitor at the time.  Was started on Toprol 12.5 mg daily and was instructed to stop lisinopril  and wait 48 hours and then begin low-dose Entresto .  Dr. Alvan recommended titrating medications over follow-up and suggested the patient would not be able to tolerate higher dose beta-blocker dosing due to heart rates, recommended to add MRA/SGLT2 inhibitor over time and to repeat echocardiogram in 3 to 6 months.  If persistent dysfunction on repeat echo, could consider pursuing ischemic evaluation.  It was noted the patient had missed his follow-up with neurology, was instructed to follow-up with them.  Monitor from [facility] showed predominant sinus rhythm.  08/23/2024 - Here for follow-up and doing well.  He is compliant with his medication and tolerating this well. Denies any chest pain, shortness of breath, palpitations, syncope, presyncope, dizziness, orthopnea, PND, swelling or significant weight changes, acute bleeding, or  claudication.  11/15/2024 - Here for follow-up. Dong well and says he did not start SGLT2i as it was too expensive. Compliant with all other medications. Denies any chest pain, shortness of breath, palpitations, syncope, presyncope, dizziness, orthopnea, PND, swelling or significant weight changes, acute bleeding, or claudication. BP is elevated today, has not taken his BP meds yet today.  ROS: Negative.  See HPI.  Studies Reviewed: SABRA    EKG: EKG is not ordered today.       ZIO XT monitor [facility] 08/2023: Conclusions:  - Ambulatory ECG monitoring was performed from 08/11/23 to 08/25/23, with  ~14 days of analyzable data.  -  The predominant rhythm was sinus rhythm, with sinus rate ranging from  42 to 139 and averaging 67 bpm.  -  Occasional PACs were recorded with  1.1% burden.  -  44 episodes of asymptomatic SVT occurred, the run with the fastest  interval lasting 7 beats with a max rate of 176 bpm, the longest lasting  16.6 secs with an avg rate of 97 bpm.  -  Occasional PVCs were recorded with  1.1% burden.  -  No episodes of wide complex tachycardia occurred.  -  There were no diary entries or patient triggered events.  -  No pauses >3 seconds or high-grade AV block detected.  -  No atrial fibrillation detected.   07/2023 Echo at [facility]: 1. The left ventricle is normal in size with normal wall thickness.    2. The left ventricular systolic function is normal, LVEF is visually  estimated at 40-45%.    3. There is grade II diastolic dysfunction (elevated filling pressure).  4. The mitral valve leaflets are mildly thickened with normal leaflet  mobility.   5. The aortic valve is trileaflet with mildly thickened leaflets with normal  excursion.   6. There is mild aortic regurgitation.    7. The left atrium is mildly to moderately dilated in size.    8. The right ventricle is normal in size, with normal systolic function.    9. There is no evidence of an interatrial flow  communication or  intrapulmonary shunt by agitated saline study.    Lexiscan 02/2015: Conclusion  Low to intermediate risk study (based on ejection fraction). Inferolateral scar with mild peri-infarct ischemia. LVEF 47% with mild global hypokinesis.    Physical Exam:   VS:  BP (!) 180/98 (BP Location: Right Arm, Cuff Size: Normal)   Pulse 64   Ht 5' 4 (1.626 m)   Wt 141 lb (64 kg)   SpO2 100%   BMI 24.20 kg/m    Wt Readings from Last 3 Encounters:  11/15/24 141 lb (64 kg)  08/23/24 145 lb 3.2 oz (65.9 kg)  07/01/24 144 lb 3.2 oz (65.4 kg)    GEN: Well nourished, well developed in no acute distress NECK: No JVD; No carotid bruits CARDIAC: S1/S2, RRR, no murmurs, rubs, gallops RESPIRATORY:  Clear to auscultation without rales, wheezing or rhonchi  ABDOMEN: Soft, non-tender, non-distended EXTREMITIES:  No edema; No deformity   ASSESSMENT AND PLAN: .    HFmrEF Stage C, NYHA class I symptoms.  EF from October 2024 Echo at [facility] was mildly reduced at 40 to 45%, grade 2 DD. Euvolemic and well compensated on exam.  He is tolerating low-dose Entresto  well.  Cannot afford SGLT2i.  Continue rest of GDMT.  Low sodium diet, fluid restriction <2L, and daily weights encouraged. Educated to contact our office for weight gain of 2 lbs overnight or 5 lbs in one week. Will update Echo at this time.   CAD, s/p CABG in 2011 Stable with no anginal symptoms. No indication for ischemic evaluation.  Continue aspirin, Coreg , and rosuvastatin . Heart healthy diet and regular cardiovascular exercise encouraged. Care and ED precautions discussed.   3.  Hyperlipidemia Most recent LDL from 05/2024 was 40.  Continue current medication regimen.  Heart healthy diet and regular cardiovascular exercise encouraged.    4.  Hypertension, bradycardia, SVT BP elevated today as he has not taken his BP meds yet. BP well controlled at home. Discussed to monitor BP at home at least 2 hours after medications and sitting for  5-10 minutes. Monitor report noted above-had some asymptomatic SVT episodes, longest episode was brief in duration.  No A-fib, pauses, high degree heart blocks, or significant arrhythmias noted on report. Denies any tachycardia or palpitations. Continue medication regimen as stated above. Heart healthy diet and regular cardiovascular exercise encouraged.   5.  History of CVA Denies any symptoms. Continue medication regimen as noted above.  Past monitor did not reveal any A-fib.  Follow-up with PCP and Neurology.    Dispo: Follow-up with me/APP in 6 months or sooner if anything changes.  Signed, Almarie Crate, NP   "

## 2024-11-16 ENCOUNTER — Telehealth: Payer: Self-pay | Admitting: Pharmacy Technician

## 2024-11-16 ENCOUNTER — Other Ambulatory Visit (HOSPITAL_COMMUNITY): Payer: Self-pay

## 2024-11-16 DIAGNOSIS — I5020 Unspecified systolic (congestive) heart failure: Secondary | ICD-10-CM

## 2024-11-16 DIAGNOSIS — Z79899 Other long term (current) drug therapy: Secondary | ICD-10-CM

## 2024-11-16 NOTE — Telephone Encounter (Signed)
 Bernett Dorothyann LABOR, RN  P Rx Med Assistance Team Pt cannot afford entresto  or farxiga .  Farxiga  was stopped 11/15/24   He doesn't have prescription insurance. There is no assistance available for entresto 

## 2024-11-23 ENCOUNTER — Ambulatory Visit: Payer: Self-pay | Attending: Nurse Practitioner

## 2024-11-23 DIAGNOSIS — I5020 Unspecified systolic (congestive) heart failure: Secondary | ICD-10-CM

## 2024-11-23 MED ORDER — LOSARTAN POTASSIUM 25 MG PO TABS: 25.0000 mg | ORAL_TABLET | Freq: Every day | ORAL | 3 refills | Status: AC

## 2024-11-23 NOTE — Addendum Note (Signed)
 Addended by: Colt Martelle M on: 11/23/2024 12:00 PM   Modules accepted: Orders

## 2024-11-23 NOTE — Telephone Encounter (Signed)
Patient informed and verbalized understanding of plan. Lab order faxed to [facility]. 

## 2024-11-23 NOTE — Telephone Encounter (Signed)
 From: Miriam Norris, NP  Sent: 11/18/2024  11:07 AM EST  To: Rosina JAYSON Cornea, CMA  Subject: RE: help with entresto ,farxiga                  Thank you for the update.  Yes, he did tell me he cannot afford Farxiga  so we have stopped that and removed this off his med list.  Okay to stop Entresto  and begin losartan  25 mg daily.    Please obtain BMET in 2-3 weeks for medication management.   Thank you!   Best,

## 2024-11-24 LAB — ECHOCARDIOGRAM COMPLETE
AR max vel: 2 cm2
AV Peak grad: 8.8 mmHg
AV Vena cont: 0.7 cm
Ao pk vel: 1.48 m/s
Area-P 1/2: 3.19 cm2
Calc EF: 35 %
P 1/2 time: 570 ms
S' Lateral: 3.6 cm
Single Plane A2C EF: 21.4 %
Single Plane A4C EF: 40.7 %

## 2024-11-28 ENCOUNTER — Ambulatory Visit: Payer: Self-pay

## 2024-11-28 ENCOUNTER — Ambulatory Visit: Payer: Self-pay | Admitting: Nurse Practitioner

## 2024-11-29 NOTE — Telephone Encounter (Signed)
-----   Message from Almarie Crate, NP sent at 11/28/2024  9:14 AM EST ----- Echo shows reduced pumping function at 35-40% but was previously 40-45%. Cannot afford Entresto /SGLT2i. Previously recommended that he possibly could not tolerate higher beta blocker doses. How are  his blood pressures doing? I recommend bringing him back in to discuss medication adjustment within the next 2-3 months and also discuss next steps.   Thanks!  Almarie Crate, AGNP-C

## 2024-11-30 ENCOUNTER — Telehealth: Payer: Self-pay

## 2024-11-30 DIAGNOSIS — Z79899 Other long term (current) drug therapy: Secondary | ICD-10-CM

## 2024-11-30 MED ORDER — SPIRONOLACTONE 25 MG PO TABS: 25.0000 mg | ORAL_TABLET | Freq: Every day | ORAL | 1 refills | Status: AC

## 2024-11-30 NOTE — Telephone Encounter (Signed)
-----   Message from Almarie Crate, NP sent at 11/29/2024 12:36 PM EST ----- Thanks a bunch! Let's go ahead and work on his heart failure management and his blood pressure control. Please begin Spironolactone  25 mg daily and let's recheck a BMET in 2 weeks from now.   Continue same follow-up with me as scheduled.   Thanks!  Best, Almarie Crate, NP ----- Message ----- From: Roy White, CMA Sent: 11/29/2024   9:10 AM EST To: Almarie Crate, NP  Patient stated that his BP has been running around 150/80. Sent schedulers a message to set him up an appointment within 2-3 months   Echo shows reduced pumping function at 35-40% but was previously 40-45%. Cannot afford Entresto /SGLT2i. Previously recommended that he possibly could not tolerate higher beta blocker doses. How are  his blood pressures doing? I recommend bringing him back in to discuss medication adjustment within the next 2-3 months and also discuss next steps.    Thanks!   Almarie Crate, AGNP-C

## 2024-11-30 NOTE — Telephone Encounter (Signed)
 Spoke with patient stated advised him of Elizabeth's recommendations. Advised him will mail out lab order and he can have completed at [facility]. Patient verbalized understanding as well as sent Spironolactone  to [facility] in [place]

## 2025-02-16 ENCOUNTER — Ambulatory Visit: Payer: Self-pay | Admitting: Nurse Practitioner
